# Patient Record
Sex: Male | Born: 1945 | Race: Asian | Hispanic: No | Marital: Married | Smoking: Current every day smoker
Health system: Southern US, Community
[De-identification: ages and names within clinical notes are randomized; demographics above are authoritative.]

## PROBLEM LIST (undated history)

## (undated) DIAGNOSIS — C61 Malignant neoplasm of prostate: Secondary | ICD-10-CM

## (undated) DIAGNOSIS — I10 Essential (primary) hypertension: Secondary | ICD-10-CM

## (undated) DIAGNOSIS — E785 Hyperlipidemia, unspecified: Secondary | ICD-10-CM

## (undated) DIAGNOSIS — G629 Polyneuropathy, unspecified: Secondary | ICD-10-CM

## (undated) DIAGNOSIS — M1990 Unspecified osteoarthritis, unspecified site: Secondary | ICD-10-CM

## (undated) DIAGNOSIS — H9190 Unspecified hearing loss, unspecified ear: Secondary | ICD-10-CM

## (undated) HISTORY — PX: NO PAST SURGERIES: SHX2092

## (undated) HISTORY — DX: Unspecified hearing loss, unspecified ear: H91.90

## (undated) HISTORY — DX: Polyneuropathy, unspecified: G62.9

## (undated) HISTORY — DX: Unspecified osteoarthritis, unspecified site: M19.90

---

## 1999-11-01 ENCOUNTER — Encounter: Payer: Self-pay | Admitting: Infectious Diseases

## 1999-11-01 ENCOUNTER — Ambulatory Visit (HOSPITAL_COMMUNITY): Admission: RE | Admit: 1999-11-01 | Discharge: 1999-11-01 | Payer: Self-pay | Admitting: Infectious Diseases

## 2005-08-15 ENCOUNTER — Emergency Department (HOSPITAL_COMMUNITY): Admission: EM | Admit: 2005-08-15 | Discharge: 2005-08-15 | Payer: Self-pay | Admitting: Emergency Medicine

## 2005-09-18 ENCOUNTER — Encounter: Admission: RE | Admit: 2005-09-18 | Discharge: 2005-12-17 | Payer: Self-pay | Admitting: Orthopaedic Surgery

## 2009-06-02 ENCOUNTER — Emergency Department (HOSPITAL_COMMUNITY): Admission: EM | Admit: 2009-06-02 | Discharge: 2009-06-02 | Payer: Self-pay | Admitting: Emergency Medicine

## 2010-07-28 ENCOUNTER — Emergency Department (HOSPITAL_COMMUNITY)
Admission: EM | Admit: 2010-07-28 | Discharge: 2010-07-28 | Payer: Self-pay | Source: Home / Self Care | Admitting: Emergency Medicine

## 2010-10-09 ENCOUNTER — Other Ambulatory Visit: Payer: Self-pay | Admitting: Otolaryngology

## 2010-10-09 DIAGNOSIS — E079 Disorder of thyroid, unspecified: Secondary | ICD-10-CM

## 2010-10-13 ENCOUNTER — Ambulatory Visit
Admission: RE | Admit: 2010-10-13 | Discharge: 2010-10-13 | Disposition: A | Discharge status: Home / Self Care | Payer: Self-pay | Source: Ambulatory Visit | Attending: Otolaryngology | Admitting: Otolaryngology

## 2010-10-13 DIAGNOSIS — E079 Disorder of thyroid, unspecified: Secondary | ICD-10-CM

## 2010-10-23 LAB — HEPATITIS B SURFACE ANTIGEN: Hepatitis B Surface Ag: NEGATIVE

## 2010-10-23 LAB — HIV RAPID SCREEN (BLD OR BODY FLD EXPOSURE): Rapid HIV Screen: NONREACTIVE

## 2010-10-23 LAB — HEPATITIS C ANTIBODY (REFLEX): HCV Ab: NEGATIVE

## 2010-11-16 LAB — CBC
Hemoglobin: 14.8 g/dL (ref 13.0–17.0)
Platelets: 232 10*3/uL (ref 150–400)
RDW: 16.3 % — ABNORMAL HIGH (ref 11.5–15.5)

## 2010-11-16 LAB — DIFFERENTIAL
Basophils Absolute: 0 10*3/uL (ref 0.0–0.1)
Lymphocytes Relative: 23 % (ref 12–46)
Monocytes Relative: 10 % (ref 3–12)
Neutro Abs: 7.7 10*3/uL (ref 1.7–7.7)

## 2010-11-16 LAB — POCT I-STAT, CHEM 8
Chloride: 102 mEq/L (ref 96–112)
HCT: 46 % (ref 39.0–52.0)
Potassium: 3.2 mEq/L — ABNORMAL LOW (ref 3.5–5.1)

## 2011-08-14 HISTORY — PX: BACK SURGERY: SHX140

## 2011-09-10 ENCOUNTER — Encounter: Payer: Self-pay | Admitting: Neurology

## 2011-09-13 ENCOUNTER — Ambulatory Visit
Admission: RE | Admit: 2011-09-13 | Discharge: 2011-09-13 | Disposition: A | Discharge status: Home / Self Care | Payer: Medicare Other | Source: Ambulatory Visit | Attending: Neurosurgery | Admitting: Neurosurgery

## 2011-09-13 ENCOUNTER — Other Ambulatory Visit: Payer: Self-pay | Admitting: Neurosurgery

## 2011-09-13 DIAGNOSIS — M549 Dorsalgia, unspecified: Secondary | ICD-10-CM

## 2011-09-13 MED ORDER — IOHEXOL 180 MG/ML  SOLN
1.0000 mL | Freq: Once | INTRAMUSCULAR | Status: AC | PRN
  Administered 2011-09-13: 1 mL via EPIDURAL

## 2011-09-13 MED ORDER — METHYLPREDNISOLONE ACETATE 40 MG/ML INJ SUSP (RADIOLOG
120.0000 mg | Freq: Once | INTRAMUSCULAR | Status: AC
  Administered 2011-09-13: 120 mg via EPIDURAL

## 2011-09-17 ENCOUNTER — Other Ambulatory Visit: Payer: Self-pay | Admitting: Neurosurgery

## 2011-10-01 ENCOUNTER — Encounter (HOSPITAL_COMMUNITY): Payer: Self-pay | Admitting: Pharmacy Technician

## 2011-10-03 ENCOUNTER — Other Ambulatory Visit: Payer: Self-pay

## 2011-10-03 ENCOUNTER — Encounter (HOSPITAL_COMMUNITY)
Admission: RE | Admit: 2011-10-03 | Discharge: 2011-10-03 | Disposition: A | Discharge status: Home / Self Care | Payer: PRIVATE HEALTH INSURANCE | Source: Ambulatory Visit | Attending: Neurosurgery | Admitting: Neurosurgery

## 2011-10-03 ENCOUNTER — Encounter (HOSPITAL_COMMUNITY): Payer: Self-pay

## 2011-10-03 ENCOUNTER — Encounter (HOSPITAL_COMMUNITY)
Admission: RE | Admit: 2011-10-03 | Discharge: 2011-10-03 | Disposition: A | Discharge status: Home / Self Care | Payer: PRIVATE HEALTH INSURANCE | Source: Ambulatory Visit | Attending: Anesthesiology | Admitting: Anesthesiology

## 2011-10-03 HISTORY — DX: Essential (primary) hypertension: I10

## 2011-10-03 HISTORY — DX: Malignant neoplasm of prostate: C61

## 2011-10-03 HISTORY — DX: Hyperlipidemia, unspecified: E78.5

## 2011-10-03 LAB — CBC
HCT: 42.9 % (ref 39.0–52.0)
MCH: 20.5 pg — ABNORMAL LOW (ref 26.0–34.0)
MCV: 60.8 fL — ABNORMAL LOW (ref 78.0–100.0)
Platelets: 245 10*3/uL (ref 150–400)
RDW: 16.5 % — ABNORMAL HIGH (ref 11.5–15.5)

## 2011-10-03 LAB — BASIC METABOLIC PANEL
CO2: 30 mEq/L (ref 19–32)
Calcium: 9.3 mg/dL (ref 8.4–10.5)
Chloride: 99 mEq/L (ref 96–112)
Creatinine, Ser: 1.05 mg/dL (ref 0.50–1.35)
Glucose, Bld: 89 mg/dL (ref 70–99)

## 2011-10-03 LAB — ABO/RH: ABO/RH(D): AB POS

## 2011-10-03 LAB — SURGICAL PCR SCREEN: MRSA, PCR: NEGATIVE

## 2011-10-03 LAB — TYPE AND SCREEN

## 2011-10-03 NOTE — Pre-Procedure Instructions (Signed)
20 Donivin Wirt  10/03/2011   Your procedure is scheduled on:  Wednesday February 27,2013  Report to [facility] at 0630 AM.  Call this number if you have problems the morning of surgery: [phone]   Remember:   Do not eat food:After Midnight.  May have clear liquids: up to 4 Hours before arrival. (up to 2:30am)  Clear liquids include soda, tea, black coffee, apple or grape juice, broth.  Take these medicines the morning of surgery with A SIP OF WATER: flomax   Do not wear jewelry, make-up or nail polish.  Do not wear lotions, powders, or perfumes. You may wear deodorant.  Do not shave 48 hours prior to surgery.  Do not bring valuables to the hospital.  Contacts, dentures or bridgework may not be worn into surgery.  Leave suitcase in the car. After surgery it may be brought to your room.  For patients admitted to the hospital, checkout time is 11:00 AM the day of discharge.   Patients discharged the day of surgery will not be allowed to drive home.  Name and phone number of your driver: Calder Oblinger [phone]  Special Instructions: CHG Shower Use Special Wash: 1/2 bottle night before surgery and 1/2 bottle morning of surgery.   Please read over the following fact sheets that you were given: Pain Booklet, Coughing and Deep Breathing, Blood Transfusion Information, MRSA Information and Surgical Site Infection Prevention

## 2011-10-09 MED ORDER — CEFAZOLIN SODIUM 1-5 GM-% IV SOLN
1.0000 g | INTRAVENOUS | Status: AC
  Administered 2011-10-10: 1 g via INTRAVENOUS
  Filled 2011-10-09: qty 50

## 2011-10-10 ENCOUNTER — Encounter (HOSPITAL_COMMUNITY): Payer: Self-pay | Admitting: Anesthesiology

## 2011-10-10 ENCOUNTER — Encounter (HOSPITAL_COMMUNITY)
Admission: RE | Disposition: A | Discharge status: Home Health | Payer: Self-pay | Source: Ambulatory Visit | Attending: Neurosurgery

## 2011-10-10 ENCOUNTER — Inpatient Hospital Stay (HOSPITAL_COMMUNITY): Payer: PRIVATE HEALTH INSURANCE

## 2011-10-10 ENCOUNTER — Inpatient Hospital Stay (HOSPITAL_COMMUNITY): Payer: PRIVATE HEALTH INSURANCE | Admitting: Anesthesiology

## 2011-10-10 ENCOUNTER — Inpatient Hospital Stay (HOSPITAL_COMMUNITY)
Admission: RE | Admit: 2011-10-10 | Discharge: 2011-10-17 | DRG: 459 | Disposition: A | Discharge status: Home Health | Payer: PRIVATE HEALTH INSURANCE | Source: Ambulatory Visit | Attending: Neurosurgery | Admitting: Neurosurgery

## 2011-10-10 DIAGNOSIS — K5900 Constipation, unspecified: Secondary | ICD-10-CM | POA: Diagnosis not present

## 2011-10-10 DIAGNOSIS — M48062 Spinal stenosis, lumbar region with neurogenic claudication: Principal | ICD-10-CM | POA: Diagnosis present

## 2011-10-10 DIAGNOSIS — M9943 Connective tissue stenosis of neural canal of lumbar region: Secondary | ICD-10-CM

## 2011-10-10 DIAGNOSIS — T50995A Adverse effect of other drugs, medicaments and biological substances, initial encounter: Secondary | ICD-10-CM | POA: Diagnosis not present

## 2011-10-10 DIAGNOSIS — F17200 Nicotine dependence, unspecified, uncomplicated: Secondary | ICD-10-CM | POA: Diagnosis present

## 2011-10-10 DIAGNOSIS — Z8546 Personal history of malignant neoplasm of prostate: Secondary | ICD-10-CM

## 2011-10-10 DIAGNOSIS — E876 Hypokalemia: Secondary | ICD-10-CM | POA: Diagnosis not present

## 2011-10-10 DIAGNOSIS — G92 Toxic encephalopathy: Secondary | ICD-10-CM | POA: Diagnosis not present

## 2011-10-10 DIAGNOSIS — G929 Unspecified toxic encephalopathy: Secondary | ICD-10-CM | POA: Diagnosis not present

## 2011-10-10 DIAGNOSIS — Z79899 Other long term (current) drug therapy: Secondary | ICD-10-CM

## 2011-10-10 DIAGNOSIS — Z01812 Encounter for preprocedural laboratory examination: Secondary | ICD-10-CM

## 2011-10-10 DIAGNOSIS — Q762 Congenital spondylolisthesis: Secondary | ICD-10-CM

## 2011-10-10 DIAGNOSIS — J189 Pneumonia, unspecified organism: Secondary | ICD-10-CM | POA: Diagnosis not present

## 2011-10-10 DIAGNOSIS — I10 Essential (primary) hypertension: Secondary | ICD-10-CM | POA: Diagnosis present

## 2011-10-10 DIAGNOSIS — M5137 Other intervertebral disc degeneration, lumbosacral region: Secondary | ICD-10-CM | POA: Diagnosis present

## 2011-10-10 DIAGNOSIS — Y9210 Unspecified residential institution as the place of occurrence of the external cause: Secondary | ICD-10-CM | POA: Diagnosis not present

## 2011-10-10 DIAGNOSIS — M51379 Other intervertebral disc degeneration, lumbosacral region without mention of lumbar back pain or lower extremity pain: Secondary | ICD-10-CM | POA: Diagnosis present

## 2011-10-10 DIAGNOSIS — E785 Hyperlipidemia, unspecified: Secondary | ICD-10-CM | POA: Diagnosis present

## 2011-10-10 SURGERY — POSTERIOR LUMBAR FUSION 2 LEVEL
Anesthesia: General | Site: Spine Lumbar | Laterality: Bilateral | Wound class: Clean

## 2011-10-10 MED ORDER — PHENYLEPHRINE HCL 10 MG/ML IJ SOLN
10.0000 mg | INTRAMUSCULAR | Status: DC | PRN
  Administered 2011-10-10: 10 ug/min via INTRAVENOUS

## 2011-10-10 MED ORDER — THROMBIN 20000 UNITS EX KIT
PACK | CUTANEOUS | Status: DC | PRN
  Administered 2011-10-10: 10:00:00 via TOPICAL

## 2011-10-10 MED ORDER — MENTHOL 3 MG MT LOZG: 1.0000 | LOZENGE | OROMUCOSAL | Status: DC | PRN

## 2011-10-10 MED ORDER — SODIUM CHLORIDE 0.9 % IV SOLN
INTRAVENOUS | Status: DC
  Administered 2011-10-10 – 2011-10-11 (×2): via INTRAVENOUS

## 2011-10-10 MED ORDER — EPHEDRINE SULFATE 50 MG/ML IJ SOLN
INTRAMUSCULAR | Status: DC | PRN
  Administered 2011-10-10 (×5): 5 mg via INTRAVENOUS

## 2011-10-10 MED ORDER — DEXTROSE 5 % IV SOLN
INTRAVENOUS | Status: DC | PRN
  Administered 2011-10-10: 09:00:00 via INTRAVENOUS

## 2011-10-10 MED ORDER — ACETAMINOPHEN 650 MG RE SUPP: 650.0000 mg | RECTAL | Status: DC | PRN

## 2011-10-10 MED ORDER — MIDAZOLAM HCL 5 MG/5ML IJ SOLN
INTRAMUSCULAR | Status: DC | PRN
  Administered 2011-10-10: 0.5 mg via INTRAVENOUS

## 2011-10-10 MED ORDER — HYDROMORPHONE HCL PF 1 MG/ML IJ SOLN
0.2500 mg | INTRAMUSCULAR | Status: DC | PRN
  Administered 2011-10-10 (×2): 0.5 mg via INTRAVENOUS

## 2011-10-10 MED ORDER — ONDANSETRON HCL 4 MG/2ML IJ SOLN: 4.0000 mg | Freq: Once | INTRAMUSCULAR | Status: DC | PRN

## 2011-10-10 MED ORDER — SIMVASTATIN 20 MG PO TABS
20.0000 mg | ORAL_TABLET | Freq: Every evening | ORAL | Status: DC
  Administered 2011-10-10 – 2011-10-16 (×7): 20 mg via ORAL
  Filled 2011-10-10 (×8): qty 1

## 2011-10-10 MED ORDER — SODIUM CHLORIDE 0.9 % IV SOLN
INTRAVENOUS | Status: AC
  Filled 2011-10-10: qty 500

## 2011-10-10 MED ORDER — BUPIVACAINE-EPINEPHRINE PF 0.5-1:200000 % IJ SOLN
INTRAMUSCULAR | Status: DC | PRN
  Administered 2011-10-10: 18 mL

## 2011-10-10 MED ORDER — TAMSULOSIN HCL 0.4 MG PO CAPS
0.4000 mg | ORAL_CAPSULE | Freq: Every day | ORAL | Status: DC
  Administered 2011-10-10 – 2011-10-16 (×7): 0.4 mg via ORAL
  Filled 2011-10-10 (×9): qty 1

## 2011-10-10 MED ORDER — ONDANSETRON HCL 4 MG/2ML IJ SOLN: 4.0000 mg | INTRAMUSCULAR | Status: DC | PRN

## 2011-10-10 MED ORDER — CEFAZOLIN SODIUM 1-5 GM-% IV SOLN
1.0000 g | Freq: Three times a day (TID) | INTRAVENOUS | Status: AC
  Administered 2011-10-10 – 2011-10-11 (×2): 1 g via INTRAVENOUS
  Filled 2011-10-10 (×2): qty 50

## 2011-10-10 MED ORDER — NEOSTIGMINE METHYLSULFATE 1 MG/ML IJ SOLN
INTRAMUSCULAR | Status: DC | PRN
  Administered 2011-10-10: 3 mg via INTRAVENOUS

## 2011-10-10 MED ORDER — LIDOCAINE HCL (CARDIAC) 20 MG/ML IV SOLN
INTRAVENOUS | Status: DC | PRN
  Administered 2011-10-10: 80 mg via INTRAVENOUS

## 2011-10-10 MED ORDER — GLYCOPYRROLATE 0.2 MG/ML IJ SOLN
INTRAMUSCULAR | Status: DC | PRN
  Administered 2011-10-10: 0.4 mg via INTRAVENOUS

## 2011-10-10 MED ORDER — HYDROMORPHONE HCL PF 1 MG/ML IJ SOLN
INTRAMUSCULAR | Status: AC
  Filled 2011-10-10: qty 1

## 2011-10-10 MED ORDER — OXYCODONE-ACETAMINOPHEN 5-325 MG PO TABS
1.0000 | ORAL_TABLET | ORAL | Status: DC | PRN
  Administered 2011-10-11 – 2011-10-12 (×4): 2 via ORAL
  Administered 2011-10-12: 1 via ORAL
  Filled 2011-10-10: qty 2
  Filled 2011-10-10: qty 1
  Filled 2011-10-10 (×3): qty 2

## 2011-10-10 MED ORDER — ONDANSETRON HCL 4 MG/2ML IJ SOLN
INTRAMUSCULAR | Status: DC | PRN
  Administered 2011-10-10: 4 mg via INTRAVENOUS

## 2011-10-10 MED ORDER — DIAZEPAM 5 MG PO TABS
5.0000 mg | ORAL_TABLET | Freq: Four times a day (QID) | ORAL | Status: DC | PRN
  Administered 2011-10-10 – 2011-10-12 (×5): 5 mg via ORAL
  Filled 2011-10-10 (×5): qty 1

## 2011-10-10 MED ORDER — PHENOL 1.4 % MT LIQD: 1.0000 | OROMUCOSAL | Status: DC | PRN

## 2011-10-10 MED ORDER — ACETAMINOPHEN 325 MG PO TABS
650.0000 mg | ORAL_TABLET | ORAL | Status: DC | PRN
  Administered 2011-10-10 – 2011-10-11 (×2): 650 mg via ORAL
  Administered 2011-10-12: 325 mg via ORAL
  Administered 2011-10-12 – 2011-10-14 (×4): 650 mg via ORAL
  Filled 2011-10-10 (×2): qty 2
  Filled 2011-10-10: qty 1
  Filled 2011-10-10 (×4): qty 2

## 2011-10-10 MED ORDER — HYDROCHLOROTHIAZIDE 25 MG PO TABS
25.0000 mg | ORAL_TABLET | Freq: Every day | ORAL | Status: DC
  Administered 2011-10-11: 25 mg via ORAL
  Filled 2011-10-10 (×3): qty 1

## 2011-10-10 MED ORDER — LACTATED RINGERS IV SOLN
INTRAVENOUS | Status: DC | PRN
  Administered 2011-10-10 (×4): via INTRAVENOUS

## 2011-10-10 MED ORDER — ROCURONIUM BROMIDE 100 MG/10ML IV SOLN
INTRAVENOUS | Status: DC | PRN
  Administered 2011-10-10 (×2): 5 mg via INTRAVENOUS
  Administered 2011-10-10: 50 mg via INTRAVENOUS

## 2011-10-10 MED ORDER — SODIUM CHLORIDE 0.9 % IJ SOLN
3.0000 mL | Freq: Two times a day (BID) | INTRAMUSCULAR | Status: DC
  Administered 2011-10-12: 3 mL via INTRAVENOUS

## 2011-10-10 MED ORDER — 0.9 % SODIUM CHLORIDE (POUR BTL) OPTIME
TOPICAL | Status: DC | PRN
  Administered 2011-10-10: 1000 mL

## 2011-10-10 MED ORDER — ZOLPIDEM TARTRATE 5 MG PO TABS: 5.0000 mg | ORAL_TABLET | Freq: Every evening | ORAL | Status: DC | PRN

## 2011-10-10 MED ORDER — SODIUM CHLORIDE 0.9 % IJ SOLN: 3.0000 mL | INTRAMUSCULAR | Status: DC | PRN

## 2011-10-10 MED ORDER — ALBUMIN HUMAN 5 % IV SOLN
INTRAVENOUS | Status: DC | PRN
  Administered 2011-10-10 (×2): via INTRAVENOUS

## 2011-10-10 MED ORDER — FENTANYL CITRATE 0.05 MG/ML IJ SOLN
INTRAMUSCULAR | Status: DC | PRN
  Administered 2011-10-10: 50 ug via INTRAVENOUS
  Administered 2011-10-10: 150 ug via INTRAVENOUS

## 2011-10-10 MED ORDER — BACITRACIN 50000 UNITS IM SOLR
INTRAMUSCULAR | Status: AC
  Filled 2011-10-10: qty 1

## 2011-10-10 MED ORDER — SODIUM CHLORIDE 0.9 % IV SOLN
250.0000 mL | INTRAVENOUS | Status: DC
Start: 2011-10-10 — End: 2011-10-12

## 2011-10-10 MED ORDER — PROPOFOL 10 MG/ML IV EMUL
INTRAVENOUS | Status: DC | PRN
  Administered 2011-10-10: 30 mg via INTRAVENOUS
  Administered 2011-10-10: 120 mg via INTRAVENOUS

## 2011-10-10 MED ORDER — MORPHINE SULFATE 4 MG/ML IJ SOLN
4.0000 mg | INTRAMUSCULAR | Status: DC | PRN
  Administered 2011-10-10: 4 mg via INTRAVENOUS
  Administered 2011-10-10: 2 mg via INTRAVENOUS
  Administered 2011-10-11 – 2011-10-12 (×2): 4 mg via INTRAVENOUS
  Filled 2011-10-10 (×4): qty 1

## 2011-10-10 SURGICAL SUPPLY — 68 items
APL SKNCLS STERI-STRIP NONHPOA (GAUZE/BANDAGES/DRESSINGS) ×1
BENZOIN TINCTURE PRP APPL 2/3 (GAUZE/BANDAGES/DRESSINGS) ×2 IMPLANT
BLADE SURG ROTATE 9660 (MISCELLANEOUS) IMPLANT
BUR ACORN 6.0 (BURR) ×2 IMPLANT
BUR MATCHSTICK NEURO 3.0 LAGG (BURR) ×3 IMPLANT
CANISTER SUCTION 2500CC (MISCELLANEOUS) ×2 IMPLANT
CAP REVERE LOCKING (Cap) ×4 IMPLANT
CLOTH BEACON ORANGE TIMEOUT ST (SAFETY) ×2 IMPLANT
CONT SPEC 4OZ CLIKSEAL STRL BL (MISCELLANEOUS) ×3 IMPLANT
COVER BACK TABLE 24X17X13 BIG (DRAPES) IMPLANT
COVER TABLE BACK 60X90 (DRAPES) ×2 IMPLANT
DRAPE C-ARM 42X72 X-RAY (DRAPES) ×4 IMPLANT
DRAPE LAPAROTOMY 100X72X124 (DRAPES) ×2 IMPLANT
DRAPE POUCH INSTRU U-SHP 10X18 (DRAPES) ×2 IMPLANT
DRSG PAD ABDOMINAL 8X10 ST (GAUZE/BANDAGES/DRESSINGS) IMPLANT
DURAPREP 26ML APPLICATOR (WOUND CARE) ×2 IMPLANT
ELECT REM PT RETURN 9FT ADLT (ELECTROSURGICAL) ×2
ELECTRODE REM PT RTRN 9FT ADLT (ELECTROSURGICAL) ×1 IMPLANT
EVACUATOR 1/8 PVC DRAIN (DRAIN) IMPLANT
GAUZE SPONGE 4X4 12PLY STRL LF (GAUZE/BANDAGES/DRESSINGS) ×1 IMPLANT
GAUZE SPONGE 4X4 16PLY XRAY LF (GAUZE/BANDAGES/DRESSINGS) ×2 IMPLANT
GLOVE BIOGEL M 8.0 STRL (GLOVE) ×3 IMPLANT
GLOVE BIOGEL PI IND STRL 7.0 (GLOVE) IMPLANT
GLOVE BIOGEL PI INDICATOR 7.0 (GLOVE) ×1
GLOVE ECLIPSE 6.5 STRL STRAW (GLOVE) ×1 IMPLANT
GLOVE EXAM NITRILE LRG STRL (GLOVE) IMPLANT
GLOVE EXAM NITRILE MD LF STRL (GLOVE) ×1 IMPLANT
GLOVE EXAM NITRILE XL STR (GLOVE) IMPLANT
GLOVE EXAM NITRILE XS STR PU (GLOVE) IMPLANT
GLOVE SURG SS PI 6.5 STRL IVOR (GLOVE) ×2 IMPLANT
GOWN BRE IMP SLV AUR LG STRL (GOWN DISPOSABLE) ×4 IMPLANT
GOWN BRE IMP SLV AUR XL STRL (GOWN DISPOSABLE) ×1 IMPLANT
GOWN STRL REIN 2XL LVL4 (GOWN DISPOSABLE) IMPLANT
KIT BASIN OR (CUSTOM PROCEDURE TRAY) ×2 IMPLANT
KIT ROOM TURNOVER OR (KITS) ×2 IMPLANT
NDL HYPO 18GX1.5 BLUNT FILL (NEEDLE) IMPLANT
NDL HYPO 21X1.5 SAFETY (NEEDLE) IMPLANT
NDL HYPO 25X1 1.5 SAFETY (NEEDLE) IMPLANT
NEEDLE HYPO 18GX1.5 BLUNT FILL (NEEDLE) IMPLANT
NEEDLE HYPO 21X1.5 SAFETY (NEEDLE) IMPLANT
NEEDLE HYPO 25X1 1.5 SAFETY (NEEDLE) ×2 IMPLANT
NS IRRIG 1000ML POUR BTL (IV SOLUTION) ×2 IMPLANT
PACK FOAM VITOSS 10CC (Orthopedic Implant) ×1 IMPLANT
PACK LAMINECTOMY NEURO (CUSTOM PROCEDURE TRAY) ×2 IMPLANT
PAD ARMBOARD 7.5X6 YLW CONV (MISCELLANEOUS) ×6 IMPLANT
PATTIES SURGICAL .5 X1 (DISPOSABLE) ×2 IMPLANT
PATTIES SURGICAL .5 X3 (DISPOSABLE) IMPLANT
ROD REVERE CURVED 6.35X35MM (Rod) ×2 IMPLANT
SCREW REVERE 6.35 5.5X40MM (Screw) ×4 IMPLANT
SET SCREW IMPLANT
SPACER SUSTAIN O SML 8X22 12MM (Spacer) ×2 IMPLANT
SPONGE GAUZE 4X4 12PLY (GAUZE/BANDAGES/DRESSINGS) ×2 IMPLANT
SPONGE LAP 4X18 X RAY DECT (DISPOSABLE) IMPLANT
SPONGE NEURO XRAY DETECT 1X3 (DISPOSABLE) IMPLANT
SPONGE SURGIFOAM ABS GEL 100 (HEMOSTASIS) ×2 IMPLANT
STRIP CLOSURE SKIN 1/2X4 (GAUZE/BANDAGES/DRESSINGS) ×2 IMPLANT
SUT VIC AB 1 CT1 18XBRD ANBCTR (SUTURE) ×2 IMPLANT
SUT VIC AB 1 CT1 8-18 (SUTURE) ×4
SUT VIC AB 2-0 CP2 18 (SUTURE) ×3 IMPLANT
SUT VIC AB 3-0 SH 8-18 (SUTURE) ×2 IMPLANT
SYR 20CC LL (SYRINGE) ×1 IMPLANT
SYR 20ML ECCENTRIC (SYRINGE) ×2 IMPLANT
SYR 5ML LL (SYRINGE) IMPLANT
TAPE HYPAFIX 4 X10 (GAUZE/BANDAGES/DRESSINGS) ×1 IMPLANT
TOWEL OR 17X24 6PK STRL BLUE (TOWEL DISPOSABLE) ×2 IMPLANT
TOWEL OR 17X26 10 PK STRL BLUE (TOWEL DISPOSABLE) ×2 IMPLANT
TRAY FOLEY CATH 14FRSI W/METER (CATHETERS) ×2 IMPLANT
WATER STERILE IRR 1000ML POUR (IV SOLUTION) ×2 IMPLANT

## 2011-10-10 NOTE — H&P (Signed)
Scott Cruz is an 66 y.o. male.   Chief Complaint: lumbar pain HPI: lbp with radiation to lower extremities which gets worse with standing and better with flexion. He has failed with conservative treatment.  Past Medical History  Diagnosis Date  . Hypertension   . Hyperlipidemia   . Prostate ca     Past Surgical History  Procedure Date  . No past surgeries     No family history on file. Social History:  reports that he has been smoking Cigarettes.  He has a 50 pack-year smoking history. He does not have any smokeless tobacco history on file. He reports that he does not drink alcohol or use illicit drugs.  Allergies: No Known Allergies  Medications Prior to Admission  Medication Dose Route Frequency Provider Last Rate Last Dose  . ceFAZolin (ANCEF) IVPB 1 g/50 mL premix  1 g Intravenous 60 min Pre-Op Karn Cassis, MD      . DISCONTD: bacitracin 16109 UNITS injection           . DISCONTD: sodium chloride 0.9 % infusion            No current outpatient prescriptions on file as of 10/10/2011.    No results found for this or any previous visit (from the past 48 hour(s)). No results found.  Review of Systems  Constitutional: Negative.   HENT: Negative.   Eyes: Negative.   Respiratory: Negative.   Cardiovascular:       Arterial hypertension  Gastrointestinal: Negative.   Musculoskeletal: Positive for back pain.  Skin: Negative.   Neurological: Positive for focal weakness.  Endo/Heme/Allergies: Negative.   Psychiatric/Behavioral: Negative.     Blood pressure 131/82, pulse 73, temperature 97 F (36.1 C), temperature source Oral, resp. rate 18, SpO2 97.00%. Physical Exam nent, nl. Neck,nl. Lungs,nl. Cv,nl. Abdomen, nl. Extremities,nl.NEURO weakness of df both feet. slr positive at 60. difficulties walking in tip toes. Mri and xrays showed grade 1 spondylolisthesis at l4-5.severe stenosis at same level and degenerative spondylosis at l3 4 witth stenosis. Assessment/Plan l4 5  discectomy witth fusion ,pedicle screws and pla with l3 bilateral laminectomies. Surgery was explained to him with the help of the translator. Scott Cruz M 10/10/2011, 8:34 AM

## 2011-10-10 NOTE — Transfer of Care (Signed)
Immediate Anesthesia Transfer of Care Note  Patient: Scott Cruz  Procedure(s) Performed: Procedure(s) (LRB): POSTERIOR LUMBAR FUSION 2 LEVEL (Bilateral)  Patient Location: PACU  Anesthesia Type: General  Level of Consciousness: awake, alert  and oriented  Airway & Oxygen Therapy: Patient Spontanous Breathing and Patient connected to nasal cannula oxygen  Post-op Assessment: Report given to PACU RN, Post -op Vital signs reviewed and unstable, Anesthesiologist notified and Patient moving all extremities  Post vital signs: Reviewed and stable  Complications: No apparent anesthesia complications

## 2011-10-10 NOTE — Op Note (Signed)
NAMEABDIRIZAK, RICHISON NO.:  [id number]  MEDICAL RECORD NO.:  [id number]  LOCATION:  3012                         FACILITY:  [facility]  PHYSICIAN:  Hilda Lias, M.D.   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  Neurogenic claudication.  Degenerative disk disease.  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  Neurogenic claudication.  Degenerative disk disease.  PROCEDURE:  Bilateral L3-L4 laminectomy, foraminotomy, bilateral L4-5 diskectomy normally we do for routine disk to be able to insert the cages, bilateral L4 facetectomy interbody fusion with cages 12 x 22, pedicle screws L4-L5, posterolateral arthrodesis L3, L4 and L5.  Cell Saver, C-arm.  SURGEON:  Hilda Lias, MD  ASSISTANT:  Coletta Memos, MD  CLINICAL HISTORY:  Mr. Salvetti is a gentleman who is 66 year old complaining of back pain radiating to both legs.  The patient has failed conservative treatment.  The pain is getting worse.  Outpatient x-ray showed that he has severe stenosis at L3, L4, L5 with spondylolisthesis at L4-5.  Surgery was advised.  The risks were explained to him and his translator who turned out to be a medical doctor in [place].  PROCEDURE:  The patient was taken to the OR, and after intubation, he was positioned on prone manner.  The back was cleaned with DuraPrep. Drapes were applied.  Midline incision from L2-3 down to L4-5 was made and muscle retracted all the way laterally until we were able to feel the transverse process.  X-ray showed 1 clip was at the level of L5 and the other one at the level L4.  From then on, we proceeded with removal of spinous process of L3-L4, and bilateral lamina of those 2 levels. With the drill, we did a wide laminectomy with  decompression of the L3 and L4 nerve root after we removed a thick yellow ligament.  At the level of L4-L5, we proceeded with facetectomy of L4.  That was the only way to be able to go laterally, to be able to do a total diskectomy. Retraction of the thecal sac and the L4-L5 nerve root was made and the endplate were removed.  Then, the 2 cages of 12 x 22 with a Vitoss and autograft was inserted first on the right side and the other one on the left side.  C-arm x-ray showed good position of the cages.  Then, using the C-arm in AP view and then a lateral view, we probed the pedicle of L4 and L5.  We were able to introduce 4 screws of 5.5 x 40.  Prior to insertion, we probed the holes just to be sure that we were surrounded by bone.  Then, the screw were connected with the rod and kept in place with caps.  Then, we went laterally and we removed the periosteum of the lateral aspect of L3-4 and L4-5 and a mix of Vitoss and autograft were used for arthrodesis.  The area was irrigated. Valsalva maneuver was negative.  We went back again, and we probed the foramina of L3, L4, and L5  with plenty of space.  There was no violation of the pedicles.  Then, the wound was closed with multiple layers with Vicryl and Steri-Strips for the skin.          ______________________________ Hilda Lias, M.D.     EB/MEDQ  D:  10/10/2011  T:  10/10/2011  Job:  [id number]

## 2011-10-10 NOTE — Progress Notes (Signed)
Operative report [phone]

## 2011-10-10 NOTE — Progress Notes (Signed)
l3 and l4 laminectomies done with fusion at l45 cages and pedicle screws.

## 2011-10-10 NOTE — Anesthesia Postprocedure Evaluation (Signed)
  Anesthesia Post-op Note  Patient: Scott Cruz  Procedure(s) Performed: Procedure(s) (LRB): POSTERIOR LUMBAR FUSION 2 LEVEL (Bilateral)  Patient Location: PACU  Anesthesia Type: General  Level of Consciousness: awake, alert , oriented, sedated and patient cooperative  Airway and Oxygen Therapy: Patient Spontanous Breathing and Patient connected to nasal cannula oxygen  Post-op Pain: mild  Post-op Assessment: Post-op Vital signs reviewed, Patient's Cardiovascular Status Stable, Respiratory Function Stable, Patent Airway, No signs of Nausea or vomiting and Pain level controlled  Post-op Vital Signs: stable  Complications: No apparent anesthesia complications

## 2011-10-10 NOTE — Preoperative (Signed)
Beta Blockers   Reason not to administer Beta Blockers:Not Applicable 

## 2011-10-10 NOTE — Progress Notes (Signed)
Pt was admitted to the unit at 1400, upon initial assessment, his dressing was completely saturated, and was reinforced and completed.  At 1500, the dressing was completely saturated, and it was reinforced and changed. Dr. Jeral Fruit was paged and MD stated to reinforce and change dressing as needed. Patient is stable and will continue to monitor patient.

## 2011-10-10 NOTE — Anesthesia Procedure Notes (Signed)
Procedure Name: Intubation Date/Time: 10/10/2011 8:57 AM Performed by: Julianne Rice K Pre-anesthesia Checklist: Patient identified, Timeout performed, Emergency Drugs available, Suction available and Patient being monitored Patient Re-evaluated:Patient Re-evaluated prior to inductionOxygen Delivery Method: Circle system utilized Preoxygenation: Pre-oxygenation with 100% oxygen Intubation Type: IV induction Ventilation: Mask ventilation without difficulty Laryngoscope Size: Miller and 2 Grade View: Grade II Tube type: Oral Tube size: 7.5 mm Number of attempts: 1 Airway Equipment and Method: Stylet and LTA kit utilized Placement Confirmation: positive ETCO2,  ETT inserted through vocal cords under direct vision and breath sounds checked- equal and bilateral Secured at: 24 cm Tube secured with: Tape Dental Injury: Teeth and Oropharynx as per pre-operative assessment

## 2011-10-10 NOTE — Anesthesia Preprocedure Evaluation (Addendum)
Anesthesia Evaluation  Patient identified by MRN, date of birth, ID band Patient awake    Reviewed: Allergy & Precautions, H&P , NPO status , Patient's Chart, lab work & pertinent test results  Airway Mallampati: I TM Distance: >3 FB Neck ROM: full    Dental   Pulmonary Current Smoker,          Cardiovascular hypertension, Pt. on medications regular Normal    Neuro/Psych    GI/Hepatic   Endo/Other    Renal/GU Currently being worked up for elevated PSA     Musculoskeletal   Abdominal   Peds  Hematology   Anesthesia Other Findings   Reproductive/Obstetrics                        Anesthesia Physical Anesthesia Plan  ASA: II  Anesthesia Plan: General   Post-op Pain Management:    Induction: Intravenous  Airway Management Planned: Oral ETT  Additional Equipment:   Intra-op Plan:   Post-operative Plan: Extubation in OR  Informed Consent: I have reviewed the patients History and Physical, chart, labs and discussed the procedure including the risks, benefits and alternatives for the proposed anesthesia with the patient or authorized representative who has indicated his/her understanding and acceptance.   Dental advisory given  Plan Discussed with: Anesthesiologist, Surgeon and CRNA  Anesthesia Plan Comments:        Anesthesia Quick Evaluation

## 2011-10-11 LAB — CBC
HCT: 31.1 % — ABNORMAL LOW (ref 39.0–52.0)
MCHC: 33.4 g/dL (ref 30.0–36.0)
Platelets: 183 10*3/uL (ref 150–400)
RDW: 16.4 % — ABNORMAL HIGH (ref 11.5–15.5)
WBC: 9.7 10*3/uL (ref 4.0–10.5)

## 2011-10-11 LAB — DIFFERENTIAL
Basophils Absolute: 0 10*3/uL (ref 0.0–0.1)
Eosinophils Absolute: 0.1 10*3/uL (ref 0.0–0.7)
Lymphocytes Relative: 22 % (ref 12–46)
Monocytes Relative: 12 % (ref 3–12)
Neutro Abs: 6.3 10*3/uL (ref 1.7–7.7)
Neutrophils Relative %: 65 % (ref 43–77)

## 2011-10-11 LAB — BASIC METABOLIC PANEL
Calcium: 8.3 mg/dL — ABNORMAL LOW (ref 8.4–10.5)
GFR calc Af Amer: 76 mL/min — ABNORMAL LOW (ref >90)
GFR calc non Af Amer: 65 mL/min — ABNORMAL LOW (ref >90)
Potassium: 3.5 mEq/L (ref 3.5–5.1)
Sodium: 140 mEq/L (ref 135–145)

## 2011-10-11 LAB — APTT: aPTT: 38 seconds — ABNORMAL HIGH (ref 24–37)

## 2011-10-11 NOTE — Progress Notes (Signed)
Occupational Therapy Evaluation Patient Details Name: Scott Cruz MRN: [id number] DOB: 1945/08/26 Today's Date: 10/11/2011  Problem List: There is no problem list on file for this patient.   Past Medical History:  Past Medical History  Diagnosis Date  . Hypertension   . Hyperlipidemia   . Prostate ca    Past Surgical History:  Past Surgical History  Procedure Date  . No past surgeries     OT Assessment/Plan/Recommendation OT Assessment Clinical Impression Statement: Pt s/p L3 and L4 laminectomies thus affecting PLOF.  Will benefit from acute OT to address below problem list in prep for d/c home with family. OT Recommendation/Assessment: Patient will need skilled OT in the acute care venue OT Problem List: Decreased activity tolerance;Decreased knowledge of use of DME or AE;Decreased knowledge of precautions;Pain OT Therapy Diagnosis : Generalized weakness;Acute pain OT Plan OT Frequency: Min 2X/week OT Treatment/Interventions: Self-care/ADL training;DME and/or AE instruction;Therapeutic activities;Patient/family education OT Recommendation Follow Up Recommendations: Home health OT Equipment Recommended: 3 in 1 bedside comode;Other (comment) (tub DME to be determined) Individuals Consulted Consulted and Agree with Results and Recommendations: Patient OT Goals Acute Rehab OT Goals OT Goal Formulation: With patient Time For Goal Achievement: 7 days ADL Goals Pt Will Perform Grooming: with modified independence;Standing at sink ADL Goal: Grooming - Progress: Goal set today Pt Will Perform Lower Body Bathing: with modified independence;Sit to stand from chair;Sit to stand from bed;with adaptive equipment ADL Goal: Lower Body Bathing - Progress: Goal set today Pt Will Perform Lower Body Dressing: with modified independence;Sit to stand from chair;Sit to stand from bed;with adaptive equipment ADL Goal: Lower Body Dressing - Progress: Goal set today Pt Will Transfer to Toilet: with  modified independence;with DME;3-in-1;Ambulation ADL Goal: Toilet Transfer - Progress: Goal set today Pt Will Perform Tub/Shower Transfer: Tub transfer;with DME;Ambulation ADL Goal: Tub/Shower Transfer - Progress: Goal set today Miscellaneous OT Goals Miscellaneous OT Goal #1: Pt will perform bed mobility with mod I in prep for EOB ADLs. OT Goal: Miscellaneous Goal #1 - Progress: Goal set today  OT Evaluation Precautions/Restrictions  Restrictions Weight Bearing Restrictions: No Prior Functioning Home Living Lives With: Spouse;Family (lives with daughter) Dolores Lory Help From: Family Type of Home: House Home Layout: Two level;Full bath on main level Alternate Level Stairs-Rails: Right Alternate Level Stairs-Number of Steps: 14 Home Access: Level entry Bathroom Shower/Tub: Tub/shower unit;Curtain Bathroom Toilet: Standard Bathroom Accessibility: Yes How Accessible: Accessible via walker Home Adaptive Equipment: None Prior Function Level of Independence: Independent with basic ADLs;Independent with gait;Independent with transfers Able to Take Stairs?: Yes Driving: Yes Vocation: Part time employment (works at [employer] with [employer]) ADL ADL Lower Body Bathing: Simulated;Maximal assistance Lower Body Bathing Details (indicate cue type and reason): due to pain and back precautions Where Assessed - Lower Body Bathing: Sit to stand from bed Lower Body Dressing: Simulated;Maximal assistance Lower Body Dressing Details (indicate cue type and reason): due to pain and back precautions  Where Assessed - Lower Body Dressing: Sit to stand from bed Toilet Transfer: Simulated;Moderate assistance Toilet Transfer Details (indicate cue type and reason): verbal and manual cues for maneuvering RW Toilet Transfer Method: Ambulating Toilet Transfer Equipment: Other (comment) (chair) Equipment Used: Rolling walker ADL Comments: Pt donned back brace with max assist sitting  EOB Vision/Perception    Cognition Cognition Arousal/Alertness: Awake/alert Orientation Level: Oriented X4 Sensation/Coordination   Extremity Assessment RUE Assessment RUE Assessment: Within Functional Limits LUE Assessment LUE Assessment: Within Functional Limits Mobility  Bed Mobility Bed Mobility: Yes Rolling Right: 3:  Mod assist Rolling Right Details (indicate cue type and reason): verbal and tactile cues given for sequencing log roll Right Sidelying to Sit: 3: Mod assist Right Sidelying to Sit Details (indicate cue type and reason): assist to trunk and shoulders to maintain back precautions Sitting - Scoot to Edge of Bed: 4: Min assist Sitting - Scoot to Edge of Bed Details (indicate cue type and reason): with use of pad Transfers Transfers: Yes Sit to Stand: 3: Mod assist;From bed;With upper extremity assist Sit to Stand Details (indicate cue type and reason): VC for maintaining back precautions Stand to Sit: 3: Mod assist;To chair/3-in-1;With armrests;With upper extremity assist Stand to Sit Details: verbal and tactile cues to reach back for arm rests; pt required demonstration before able to safely complete transfer Exercises   End of Session OT - End of Session Equipment Utilized During Treatment: Gait belt;Back brace Activity Tolerance: Patient limited by pain;Other (comment) Patient left: in chair;with call bell in reach;with family/visitor present Nurse Communication: Mobility status for transfers;Mobility status for ambulation;Other (comment) (pt maintained BP throughout session) General Behavior During Session: [facility] for tasks performed Cognition: [facility] for tasks performed   Cipriano Mile 10/11/2011, 3:52 PM  10/11/2011 Cipriano Mile OTR/L Pager [phone] Office [phone]

## 2011-10-11 NOTE — Evaluation (Signed)
Physical Therapy Evaluation Patient Details Name: Scott Cruz MRN: [id number] DOB: August 03, 1946 Today's Date: 10/11/2011  Problem List: There is no problem list on file for this patient.   Past Medical History:  Past Medical History  Diagnosis Date  . Hypertension   . Hyperlipidemia   . Prostate ca    Past Surgical History:  Past Surgical History  Procedure Date  . No past surgeries     PT Assessment/Plan/Recommendation PT Assessment Clinical Impression Statement: Pt is 66 y/o male admitted for lumbar fusion limited due to pain.  Pt will benefit from acute PT services to improve overall mobility to prepare for safe d/c home with family. PT Recommendation/Assessment: Patient will need skilled PT in the acute care venue PT Problem List: Decreased strength;Decreased activity tolerance;Decreased balance;Decreased knowledge of use of DME;Decreased knowledge of precautions;Pain PT Therapy Diagnosis : Difficulty walking;Acute pain PT Plan PT Frequency: Min 5X/week PT Treatment/Interventions: DME instruction;Gait training;Functional mobility training;Therapeutic activities;Therapeutic exercise;Balance training;Patient/family education PT Recommendation Follow Up Recommendations: Home health PT Equipment Recommended: 3 in 1 bedside comode;Other (comment);Rolling walker with 5" wheels (tub DME to be determined) PT Goals  Acute Rehab PT Goals PT Goal Formulation: With patient Time For Goal Achievement: 7 days Pt will Roll Supine to Right Side: with modified independence PT Goal: Rolling Supine to Right Side - Progress: Goal set today Pt will go Supine/Side to Sit: with modified independence PT Goal: Supine/Side to Sit - Progress: Goal set today Pt will go Sit to Supine/Side: with modified independence PT Goal: Sit to Supine/Side - Progress: Goal set today Pt will go Sit to Stand: with modified independence PT Goal: Sit to Stand - Progress: Goal set today Pt will go Stand to Sit: with  modified independence PT Goal: Stand to Sit - Progress: Goal set today Pt will Ambulate: >150 feet;with modified independence;with rolling walker PT Goal: Ambulate - Progress: Goal set today  PT Evaluation Precautions/Restrictions  Precautions Precautions: Back Precaution Booklet Issued: Yes (comment) Precaution Comments: Reviewed 3/3 back precautions. Required Braces or Orthoses: Yes Spinal Brace: Lumbar corset;Applied in sitting position Restrictions Weight Bearing Restrictions: No Prior Functioning  Home Living Lives With: Spouse;Family (lives with daughter) Dolores Lory Help From: Family Type of Home: House Home Layout: Two level;Full bath on main level Alternate Level Stairs-Rails: Right Alternate Level Stairs-Number of Steps: 14 Home Access: Level entry Bathroom Shower/Tub: Tub/shower unit;Curtain Bathroom Toilet: Standard Bathroom Accessibility: Yes How Accessible: Accessible via walker Home Adaptive Equipment: None Prior Function Level of Independence: Independent with basic ADLs;Independent with gait;Independent with transfers Able to Take Stairs?: Yes Driving: Yes Vocation: Part time employment (works at [employer] with [employer]) Cognition Cognition Arousal/Alertness: Awake/alert Orientation Level: Oriented X4 Sensation/Coordination Sensation Light Touch: Appears Intact Extremity Assessment RUE Assessment RUE Assessment: Within Functional Limits LUE Assessment LUE Assessment: Within Functional Limits RLE Assessment RLE Assessment: Within Functional Limits LLE Assessment LLE Assessment: Within Functional Limits Mobility (including Balance) Bed Mobility Bed Mobility: Yes Rolling Right: 3: Mod assist Rolling Right Details (indicate cue type and reason): (A) to initiate and complete roll with cues for proper technique and sequencing to prevent twisting. Right Sidelying to Sit: 3: Mod assist Right Sidelying to Sit Details (indicate cue type and  reason): (A) to elevate trunk OOB and LE OOB with cues for proper technique Sitting - Scoot to Edge of Bed: 4: Min assist Sitting - Scoot to [place] of Bed Details (indicate cue type and reason): with use of pad Transfers Transfers: Yes Sit to Stand: 3: Mod assist;From  bed;With upper extremity assist Sit to Stand Details (indicate cue type and reason): (A) to initiate transfer with proper cues for technique. Stand to Sit: 3: Mod assist;To chair/3-in-1;With armrests;With upper extremity assist Stand to Sit Details:   verbal and tactile cues to reach back for arm rests; pt required demonstration before able to safely complete transfer  Ambulation/Gait Ambulation/Gait: Yes Ambulation/Gait Assistance: 4: Min assist Ambulation/Gait Assistance Details (indicate cue type and reason): (A) to maintain balance and proper body position within RW.  Pt tends to ambulate too far from RW.  Pt having difficulty manuevering RW. Ambulation Distance (Feet): 30 Feet Assistive device: Rolling walker Gait Pattern: Step-to pattern Stairs: No Wheelchair Mobility Wheelchair Mobility: No  Posture/Postural Control Posture/Postural Control: No significant limitations Balance Balance Assessed: No Exercise    End of Session PT - End of Session Equipment Utilized During Treatment: Gait belt;Back brace Activity Tolerance: Patient tolerated treatment well Patient left: in chair;with call bell in reach Nurse Communication: Mobility status for transfers;Mobility status for ambulation General Behavior During Session: [facility] for tasks performed Cognition: [facility] for tasks performed  Kseniya Grunden 10/11/2011, 4:07 PM Pager:  [phone]

## 2011-10-11 NOTE — Progress Notes (Signed)
UR COMPLETED  

## 2011-10-11 NOTE — Progress Notes (Signed)
Noted order for [facility] and DME, however PT/OT eval not yet in chart, will follow for recommendations.  Johny Shock RN MPH Case Manager [phone]

## 2011-10-11 NOTE — Progress Notes (Signed)
At 0200, pt had temp of 101.7 F. Pt given incentive spirometer and he demonstrated appropriate use. Acetaminophen given and temp came  down to 98.9 F at 0300.  Pt's dressing saturated at this time. Active bleeding noted when no  pressure applied to site. Dressing changed. Will continue to monitor.

## 2011-10-11 NOTE — Progress Notes (Signed)
Patient ID: Scott Cruz, male   DOB: 23-Feb-1946, 66 y.o.   MRN: [id number] Wound saturated. No leg pain but incisional pain. Had increase temp last nite. Getting IS. Ot/pt to see. Foley to be removed once he is oob. Lab w/u pending.

## 2011-10-12 ENCOUNTER — Inpatient Hospital Stay (HOSPITAL_COMMUNITY): Payer: PRIVATE HEALTH INSURANCE

## 2011-10-12 DIAGNOSIS — E860 Dehydration: Secondary | ICD-10-CM

## 2011-10-12 DIAGNOSIS — J159 Unspecified bacterial pneumonia: Secondary | ICD-10-CM

## 2011-10-12 DIAGNOSIS — G9340 Encephalopathy, unspecified: Secondary | ICD-10-CM

## 2011-10-12 LAB — URINALYSIS, ROUTINE W REFLEX MICROSCOPIC
Bilirubin Urine: NEGATIVE
Glucose, UA: NEGATIVE mg/dL
Ketones, ur: 40 mg/dL — AB
Nitrite: NEGATIVE
Protein, ur: 100 mg/dL — AB
pH: 6 (ref 5.0–8.0)

## 2011-10-12 LAB — BLOOD GAS, ARTERIAL
Drawn by: 33721
O2 Content: 2 L/min
Patient temperature: 98.6
TCO2: 33.5 mmol/L (ref 0–100)
pCO2 arterial: 45.3 mmHg — ABNORMAL HIGH (ref 35.0–45.0)
pH, Arterial: 7.464 — ABNORMAL HIGH (ref 7.350–7.450)

## 2011-10-12 LAB — LACTIC ACID, PLASMA: Lactic Acid, Venous: 0.9 mmol/L (ref 0.5–2.2)

## 2011-10-12 LAB — URINE MICROSCOPIC-ADD ON

## 2011-10-12 LAB — PROCALCITONIN: Procalcitonin: 1.71 ng/mL

## 2011-10-12 MED ORDER — HYDROCODONE-ACETAMINOPHEN 10-325 MG PO TABS
1.0000 | ORAL_TABLET | ORAL | Status: DC | PRN
  Administered 2011-10-12 – 2011-10-17 (×10): 1 via ORAL
  Filled 2011-10-12 (×10): qty 1

## 2011-10-12 MED ORDER — LEVOFLOXACIN IN D5W 750 MG/150ML IV SOLN
750.0000 mg | INTRAVENOUS | Status: DC
  Administered 2011-10-12 – 2011-10-14 (×3): 750 mg via INTRAVENOUS
  Filled 2011-10-12 (×4): qty 150

## 2011-10-12 MED ORDER — TAMSULOSIN HCL 0.4 MG PO CAPS: 0.4000 mg | ORAL_CAPSULE | Freq: Every day | ORAL | Status: DC

## 2011-10-12 MED ORDER — ALBUTEROL SULFATE (5 MG/ML) 0.5% IN NEBU: 2.5000 mg | INHALATION_SOLUTION | RESPIRATORY_TRACT | Status: DC | PRN

## 2011-10-12 MED ORDER — DIAZEPAM 2 MG PO TABS: 2.0000 mg | ORAL_TABLET | Freq: Four times a day (QID) | ORAL | Status: DC | PRN

## 2011-10-12 MED ORDER — SODIUM CHLORIDE 0.9 % IV SOLN
INTRAVENOUS | Status: DC
  Administered 2011-10-12 – 2011-10-14 (×2): via INTRAVENOUS

## 2011-10-12 NOTE — Progress Notes (Signed)
Patient ID: Scott Cruz, male   DOB: 05-31-1946, 66 y.o.   MRN: [id number] Seen by pulmonologist. To r/o pneumonia. Still quite sleepy. See new orders. Dressing dry.

## 2011-10-12 NOTE — Consult Note (Signed)
Name: Scott Cruz MRN: [id number] DOB: 1946/06/14    LOS: 2  PCCM PROGRESS NOTE  History of Present Illness:  66 y/o [place] M, current smoker, with PMH of HTN, HLD, and prostate cancer s/p treatment (pt thinks radiation seeds) admitted on 2/27 for scheduled posterior lumbar fusion of L3-L4, L4-5 diskectomy.  3/1 patient noted to have ronchi, fever and cxr concerning for PNA.  PCCM consulted for PNA 3/2.    Lines / Drains: None  Cultures: 3/1 BCx2>>> 3/1 Resp culture>>>  Antibiotics: 3/1 Levaquin (? PNA)>>>  Tests / Events: 3/1  CXR>>> Pulmonary vascular congestion, atelectasis  Past Medical History  Diagnosis Date  . Hypertension   . Hyperlipidemia   . Prostate ca    Past Surgical History  Procedure Date  . No past surgeries    Prior to Admission medications   Medication Sig Start Date End Date Taking? Authorizing Provider  hydrochlorothiazide (HYDRODIURIL) 25 MG tablet Take 25 mg by mouth daily.   Yes Historical Provider, MD  simvastatin (ZOCOR) 20 MG tablet Take 20 mg by mouth every evening.   Yes Historical Provider, MD  Tamsulosin HCl (FLOMAX) 0.4 MG CAPS Take 0.4 mg by mouth daily.   Yes Historical Provider, MD   Allergies No Known Allergies  Family History No family history on file.  Social History  reports that he has been smoking Cigarettes.  He has a 50 pack-year smoking history. He does not have any smokeless tobacco history on file. He reports that he does not drink alcohol or use illicit drugs.  Review Of Systems:  Gen: Denies weight change, fatigue, night sweats.  Indicates fever, chills HEENT: Denies blurred vision, double vision, hearing loss, tinnitus, sinus congestion, rhinorrhea, sore throat, neck stiffness, dysphagia PULM: Denies shortness of breath, hemoptysis, Indicates wheezing, cough, sputum production CV: Denies chest pain, edema, orthopnea, paroxysmal nocturnal dyspnea, palpitations GI: Denies abdominal pain, nausea, vomiting, diarrhea,  hematochezia, melena, constipation, change in bowel habits GU: Denies dysuria, hematuria, polyuria, oliguria, urethral discharge.  C/o difficulty voiding.  Endocrine: Denies hot or cold intolerance, polyuria, polyphagia or appetite change Derm: Denies rash, dry skin, scaling or peeling skin change Heme: Denies easy bruising, bleeding, bleeding gums Neuro: Denies headache, numbness, weakness, slurred speech, loss of memory or consciousness  Vital Signs: Filed Vitals:   10/12/11 0634 10/12/11 0800 10/12/11 1026 10/12/11 1315  BP: 105/62 100/64  105/67  Pulse: 98  90   Temp: 102.8 F (39.3 C) 99.1 F (37.3 C) 99.3 F (37.4 C)   TempSrc: Oral  Oral   Resp: 18  18   Height:      Weight:      SpO2: 96%  90%    Physical Examination: General: wdwn elderly male in NAD Neuro: AAOx4, mild sedation, interacts appropriately, MAE CV: s1s2 rrr, no m/r/g, no JVD PULM: resp's even/non-labored, lungs bilaterally coarse, few scattered rhonchi, exp wheezes GI: round / soft, bsx4 active Extremities:  Warm / dry, no edema.   Labs     CBC  Lab 10/11/11 1117  HGB 10.4*  HCT 31.1*  WBC 9.7  PLT 183   BMET  Lab 10/11/11 1117  NA 140  K 3.5  CL 102  CO2 31  GLUCOSE 94  BUN 10  CREATININE 1.13  CALCIUM 8.3*  MG --  PHOS --   Radiology: 3/1 CXR>>>Acute on chronic interstitial thickening. Favor mild pulmonary venous congestion. Low lung volumes with patchy left base air space disease.   Assessment and Plan:  Postop  fever, atelectasis vs. pneumonia (CAP) Encephalopathy, likely secondary to opioids / benzodiazepines Difficulty voiding Dehydration -Levaquin IV for now -respiratory & blood cultures, PCT, lactate -CXR in AM -continue incentive spirometry -limit opioids / benzodiazepines -check ABG -hold HCTZ -IVF bolus -resume Flomax  Canary Brim, NP-C [facility] Pgr: [phone]  10/12/2011, 1:58 PM   Patient examined, records reviewed, assessment and  plan as above.  Orlean Bradford, M.D. Pulmonary and Critical Care Medicine [facility] Cell: [phone] Pager: [phone]

## 2011-10-12 NOTE — Progress Notes (Signed)
CSW received consult for SNF. Currently, PT is recommending home health. RNCM is aware and following. Pt's wife would like a Administrator, arts interpreter, RNCM is aware. CSW is signing off as dispo is for pt to discharge home with home health services. Please reconsult if a clinical social work needs arises prior to discharge.   Dede Query, MSW, Theresia Majors [phone]

## 2011-10-12 NOTE — Progress Notes (Signed)
Occupational Therapy Treatment Patient Details Name: Rhydian Baldi MRN: [id number] DOB: 08-Sep-1945 Today's Date: 10/12/2011  OT Assessment/Plan OT Assessment/Plan Comments on Treatment Session: Pt extremely lethargic this PM and limited by fatigue and pain.  Pt's O2 levels fluctuated between 87-95% during sit to stand activity.  Pt with c/o of dizziness in standing but unable to tolerate standing long enough for BP reading.  RN made aware.  Educated and instructed pt on use of incentive spirometer.  Pt able to complete 10 reps of breathing exercise with incentive spirometer correctly.   OT Plan: Discharge plan remains appropriate OT Frequency: Min 2X/week Follow Up Recommendations: Home health OT Equipment Recommended: 3 in 1 bedside comode;Other (comment) (tub DME to be determined) OT Goals ADL Goals Pt Will Transfer to Toilet: with modified independence;with DME;3-in-1;Ambulation ADL Goal: Toilet Transfer - Progress: Progressing toward goals Miscellaneous OT Goals Miscellaneous OT Goal #1: Pt will perform bed mobility with mod I in prep for EOB ADLs. OT Goal: Miscellaneous Goal #1 - Progress: Progressing toward goals  OT Treatment Precautions/Restrictions  Precautions Precautions: Back Required Braces or Orthoses: Yes Spinal Brace: Lumbar corset;Applied in sitting position Restrictions Weight Bearing Restrictions: No   ADL ADL Toilet Transfer: Simulated;+2 Total assistance;Comment for patient % (60%) Toilet Transfer Details (indicate cue type and reason): Pt side stepped to [facility] with max VC and tactile cues for sequencing. Toilet Transfer Method: Other (comment) (side step) Acupuncturist: Other (comment) (bed) Equipment Used: Rolling walker ADL Comments: Pt donned back brace with max assist sitting EOB. Mobility  Bed Mobility Bed Mobility: Yes Rolling Right: 3: Mod assist;With rail Rolling Right Details (indicate cue type and reason): assist to initiate and complete  roll with VC for proper technique to avoid twisting Right Sidelying to Sit: 2: Max assist;HOB flat;With rails Right Sidelying to Sit Details (indicate cue type and reason): assist to elevate trunk and bil. LE OOB; VC for proper technique Sitting - Scoot to Edge of Bed: 4: Min assist Sitting - Scoot to [place] of Bed Details (indicate cue type and reason): with use of pad Transfers Transfers: Yes Sit to Stand: 1: +2 Total assist;From bed;With upper extremity assist;Other (comment) (60%) Sit to Stand Details (indicate cue type and reason): assist to initiate transfer with VC for technique and hand placement Stand to Sit: 3: Mod assist;To bed;With upper extremity assist Stand to Sit Details: verbal and tactile cues to reach back for bed before beginning descent. Exercises    End of Session OT - End of Session Equipment Utilized During Treatment: Gait belt;Back brace Activity Tolerance: Patient limited by fatigue;Patient limited by pain Patient left: in bed;with call bell in reach;with family/visitor present Nurse Communication: Mobility status for transfers;Other (comment) (O2 levels) General Behavior During Session: Lethargic Cognition: WFL for tasks performed  Cipriano Mile OTR/L Pager [phone] Office [phone]

## 2011-10-12 NOTE — Progress Notes (Signed)
Physical Therapy Treatment Patient Details Name: Phat Dalton MRN: [id number] DOB: 03-23-1946 Today's Date: 10/12/2011  PT Assessment/Plan  PT - Assessment/Plan Comments on Treatment Session: Pt limited today due to lethargic and decrease SaO2 to 87% with standing and 2L O2.  Pt continue to c/o dizziness and was returned to supine.  Educated and pt performed spirometer 10 times an hour.  RN notified about decrease in O2 and aware of lethargy.  Will continue to follow and encourage increase mobility. PT Plan: Discharge plan remains appropriate;Frequency remains appropriate PT Frequency: Min 5X/week Follow Up Recommendations: Home health PT Equipment Recommended: Rolling walker with 5" wheels PT Goals  Acute Rehab PT Goals PT Goal Formulation: With patient Time For Goal Achievement: 7 days Pt will Roll Supine to Right Side: with modified independence PT Goal: Rolling Supine to Right Side - Progress: Progressing toward goal Pt will go Supine/Side to Sit: with modified independence PT Goal: Supine/Side to Sit - Progress: Progressing toward goal Pt will go Sit to Supine/Side: with modified independence PT Goal: Sit to Supine/Side - Progress: Progressing toward goal Pt will go Sit to Stand: with modified independence PT Goal: Sit to Stand - Progress: Progressing toward goal Pt will go Stand to Sit: with modified independence PT Goal: Stand to Sit - Progress: Progressing toward goal Pt will Ambulate: >150 feet;with modified independence;with rolling walker PT Goal: Ambulate - Progress: Progressing toward goal  PT Treatment Precautions/Restrictions  Precautions Precautions: Back Precaution Booklet Issued: Yes (comment) Precaution Comments: Reviewed 3/3 back precautions. Required Braces or Orthoses: Yes Spinal Brace: Lumbar corset;Applied in sitting position Restrictions Weight Bearing Restrictions: No Mobility (including Balance) Bed Mobility Bed Mobility: Yes Rolling Right: 3: Mod  assist;With rail Rolling Right Details (indicate cue type and reason): (A) to initiate and complete roll with cues for proper technique and sequencing to prevent twisting. Right Sidelying to Sit: 2: Max assist;HOB flat;With rails Right Sidelying to Sit Details (indicate cue type and reason): (A) to elevate trunk OOB and LE OOB with cues for proper technique Sitting - Scoot to Edge of Bed: 4: Min assist Sitting - Scoot to [place] of Bed Details (indicate cue type and reason): (A) to advance hips to EOB with cues for proper technique Sit to Supine: 1: +2 Total assist;Patient percentage (comment);HOB flat (Pt 60%) Sit to Supine - Details (indicate cue type and reason): (A) to slowly descend trunk and elevate LE into bed Transfers Transfers: Yes Sit to Stand: 1: +2 Total assist;From bed;With upper extremity assist;Other (comment) (60%) Sit to Stand Details (indicate cue type and reason):   assist to initiate transfer with VC for technique and hand placement  Stand to Sit: 3: Mod assist;To bed;With upper extremity assist Stand to Sit Details:   verbal and tactile cues to reach back for bed before beginning descent.  Ambulation/Gait Ambulation/Gait: Yes Ambulation/Gait Assistance: 1: +2 Total assist;Patient percentage (comment) (pt 70%) Ambulation/Gait Assistance Details (indicate cue type and reason): Pt able to take 3-4 side steps to [facility] with cues for technique.  (A) to maneuver RW and maintain upright posture. Ambulation Distance (Feet): 4 Feet (side steps) Assistive device: Rolling walker Gait Pattern: Step-to pattern  Posture/Postural Control Posture/Postural Control: No significant limitations Balance Balance Assessed: Yes Static Sitting Balance Static Sitting - Balance Support: Feet supported Static Sitting - Level of Assistance: 4: Min assist Static Sitting - Comment/# of Minutes: Intermittent min (A) for safety with max (A) to donn back brace Exercise    End of Session PT - End of  Session Equipment Utilized During Treatment: Gait belt;Back brace Activity Tolerance: Patient limited by fatigue Patient left: with call bell in reach;in bed;with family/visitor present Nurse Communication: Mobility status for transfers;Mobility status for ambulation General Behavior During Session: Lethargic Cognition: WFL for tasks performed  Scott Cruz 10/12/2011, 2:06 PM Pager:  [phone]

## 2011-10-12 NOTE — Progress Notes (Signed)
  Pt unable to void during night. Bladder scan at 0630 showed  725cc. I&O cath done; resulted in 800cc clear, yellow urine. Will continue to monitor.

## 2011-10-12 NOTE — Progress Notes (Signed)
Pt. remains drowsy, now with temp 102.8 ax.; Tylenol given, still unable to void; placed foley to gravity. Pt. With cloudy urine and bacteria visible. Increased wheezing with sats 94% 2L. Dr. Craige Cotta notified of this, will place new orders.

## 2011-10-12 NOTE — Progress Notes (Signed)
OT Cancellation Note  Treatment cancelled today due to patient receiving procedure.  Will re-attempt when pt returns to room.  Thanks!  10/12/2011 Cipriano Mile OTR/L Pager [phone] Office [phone]

## 2011-10-12 NOTE — Progress Notes (Signed)
Patient ID: Scott Cruz, male   DOB: 1946-01-21, 66 y.o.   MRN: [id number] Febril. Bilateral ronchii. C/o incisional pain. No weakness. Plan  D/c morphine. Xray chest. Respiratory consult

## 2011-10-12 NOTE — Significant Event (Signed)
Pt noted to have wheezing.  Will give prn neb tx with albuterol.    Foley placed with dirty appearing urine.  Will send for u/a and urine culture.  Coralyn Helling, MD 10/12/2011, 6:34 PM [phone]

## 2011-10-13 ENCOUNTER — Inpatient Hospital Stay (HOSPITAL_COMMUNITY): Payer: PRIVATE HEALTH INSURANCE

## 2011-10-13 NOTE — Progress Notes (Signed)
BP 94/58  Pulse 81  Temp(Src) 98.1 F (36.7 C) (Oral)  Resp 20  Ht 5\' 4"  (1.626 m)  Wt 70.8 kg (156 lb 1.4 oz)  BMI 26.79 kg/m2  SpO2 97% Alert, following commands. Still with a great deal of pain in lower back Moving lower extremities well. Wound clean. Dressing stained with sanguineous drainage. UA clean Leave foley in place for now.

## 2011-10-13 NOTE — Progress Notes (Signed)
History of Present Illness:  66 y/o [place] M, current smoker, with PMH of HTN, HLD, and prostate cancer s/p treatment (pt thinks radiation seeds) admitted on 2/27 for scheduled posterior lumbar fusion of L3-L4, L4-5 diskectomy. 3/1 patient noted to have ronchi, fever and cxr concerning for PNA. PCCM consulted for PNA 3/2.   Subjective: He is lethargic but c/o pains in shoulders, not chest.  Wife in room with no concern expressed. Nurse comments on drowsiness after single Vicodin and will discuss w/ NSGY.  Objective: Vital signs in last 24 hours: Temp:  [97.9 F (36.6 C)-102.8 F (39.3 C)] 100.6 F (38.1 C) (03/02 1050) Pulse Rate:  [81-99] 89  (03/02 1050) Resp:  [18-20] 18  (03/02 1050) BP: (94-121)/(58-70) 116/61 mmHg (03/02 1050) SpO2:  [91 %-100 %] 91 % (03/02 1050)  Physical Examination:  General: wdwn elderly male in NAD. Drowsy, passive, supine, non-toxic Neuro: AAOx4, mild sedation, interacts verbally 1-2 word answers, MAE  CV: s1s2 rrr, no m/r/g, no JVD  PULM: resp's even/non-labored, lungs bilaterally coarse R>L, few scattered rhonchi, no wheeze, unlabored GI: round / soft, bsx4 active  Extremities: Warm / dry, no edema.    Lab Results:  [facility] 10/11/11 1117  WBC 9.7  HGB 10.4*  HCT 31.1*  PLT 183   BMET  Basename 10/11/11 1117  NA 140  K 3.5  CL 102  CO2 31  GLUCOSE 94  BUN 10  CREATININE 1.13  CALCIUM 8.3*    Studies/Results: Dg Chest 2 View  10/12/2011  *RADIOLOGY REPORT*  Clinical Data: Fever.  Cough.  Shortness of breath and hypertension.Recently postop for lumbar spine surgery.  CHEST - 2 VIEW  Comparison: 10/03/2011  Findings: Midline trachea.  Borderline cardiomegaly, accentuated by low lung volumes. No pleural effusion or pneumothorax.  There is chronic pulmonary interstitial thickening.  This is felt to be slightly increased, suggesting a superimposed acute or subacute component.  More focal opacity at the left lung base.  IMPRESSION:  1.   Acute on chronic interstitial thickening.  Favor mild pulmonary venous congestion. 2.  Low lung volumes with patchy left base air space disease.  This could represent atelectasis or early infection.  Consider short- term radiographic follow-up.  Original Report Authenticated By: Consuello Bossier, M.D.   Dg Chest Port 1 View  10/13/2011  *RADIOLOGY REPORT*  Clinical Data: Shortness of breath.  Postop lumbar spine surgery.  PORTABLE CHEST - 1 VIEW  Comparison: 10/12/2011  Findings: Patchy opacity within the right lower lung is unchanged - question atelectasis versus mild pneumonia. The cardiomediastinal silhouette is unremarkable. There is no evidence of pleural effusion or pneumothorax. No acute bony abnormalities are present.  IMPRESSION: Unchanged mild patchy opacity within the right lower lung - question atelectasis versus early / mild pneumonia.  Original Report Authenticated By: Rosendo Gros, M.D.    Medications: reviewec  Assessment/Plan: 1) Mild pneumonia RLL- Levaquin started 3/1. Cx pending.   Plan: CXR tomorrow 2) Dehydration 3) Encephalopathy/ sedation- just sensitive to opiates?- Nurse to discuss w/ Neurosurgery 4) Difficulty voiding- hx XRT for prostate Ca -Foley to be reconsidered daily.  LOS: 3 days   Keyera Hattabaugh D 10/13/2011, 10:55 AM

## 2011-10-14 ENCOUNTER — Inpatient Hospital Stay (HOSPITAL_COMMUNITY): Payer: PRIVATE HEALTH INSURANCE

## 2011-10-14 DIAGNOSIS — G9340 Encephalopathy, unspecified: Secondary | ICD-10-CM

## 2011-10-14 DIAGNOSIS — E860 Dehydration: Secondary | ICD-10-CM

## 2011-10-14 DIAGNOSIS — J159 Unspecified bacterial pneumonia: Secondary | ICD-10-CM

## 2011-10-14 LAB — URINE CULTURE
Colony Count: NO GROWTH
Culture  Setup Time: 201303020143

## 2011-10-14 MED ORDER — DOCUSATE SODIUM 100 MG PO CAPS
100.0000 mg | ORAL_CAPSULE | Freq: Two times a day (BID) | ORAL | Status: DC
  Administered 2011-10-14 – 2011-10-16 (×6): 100 mg via ORAL
  Filled 2011-10-14 (×4): qty 1

## 2011-10-14 MED ORDER — MAGNESIUM CITRATE PO SOLN
1.0000 | Freq: Once | ORAL | Status: AC
  Administered 2011-10-14: 1 via ORAL
  Filled 2011-10-14: qty 296

## 2011-10-14 NOTE — Progress Notes (Signed)
Physical Therapy Treatment Patient Details Name: Scott Cruz MRN: [id number] DOB: April 28, 1946 Today's Date: 10/14/2011  PT Assessment/Plan  PT - Assessment/Plan Comments on Treatment Session: Increased activity tolerance today with pt ambulating to chair. Still with reports of dizziness with standing up, with deep breathing and eye fixation on target pt reported decreased dizziness and was able to continue with gait to chair. O2 sats greater than/equal to 96% on 2 lpm throughtout session. PT Plan: Discharge plan remains appropriate;Frequency remains appropriate PT Frequency: Min 5X/week Follow Up Recommendations: Home health PT Equipment Recommended: Rolling walker with 5" wheels PT Goals  Acute Rehab PT Goals PT Goal: Rolling Supine to Right Side - Progress: Progressing toward goal PT Goal: Supine/Side to Sit - Progress: Progressing toward goal PT Goal: Sit to Stand - Progress: Progressing toward goal PT Goal: Stand to Sit - Progress: Progressing toward goal PT Goal: Ambulate - Progress: Progressing toward goal  PT Treatment Precautions/Restrictions  Precautions Precautions: Back Precaution Booklet Issued: Yes (comment) Precaution Comments: Re-educated pt/spouse on 3/3 back precautions. Required Braces or Orthoses: Yes Spinal Brace: Lumbar corset;Applied in sitting position (total assist to don brace today at edge of bed) Restrictions Weight Bearing Restrictions: No Mobility (including Balance) Bed Mobility Rolling Right: 4: Min assist;With rail Right Sidelying to Sit: 4: Min assist;HOB flat;With rails Sitting - Scoot to Edge of Bed: 5: Supervision Transfers Sit to Stand: 1: +2 Total assist;With upper extremity assist;From elevated surface;From bed;Other (comment) (pt= 75%) Stand to Sit: 4: Min assist;With upper extremity assist;With armrests;To chair/3-in-1 Ambulation/Gait Ambulation/Gait Assistance: 4: Min assist Assistive device: Rolling walker Gait Pattern: Step-through  pattern;Decreased step length - right;Decreased step length - left;Decreased stride length;Shuffle  Posture/Postural Control Posture/Postural Control: No significant limitations Static Sitting Balance Static Sitting - Balance Support: Feet supported (intermittent UE support on bed) Static Sitting - Level of Assistance: 5: Stand by assistance    End of Session PT - End of Session Equipment Utilized During Treatment: Gait belt;Back brace Activity Tolerance: Patient tolerated treatment well;Treatment limited secondary to medical complications (Comment) (dizzy with intital stdg, cleared enougth to amb to chair) Patient left: in chair;with call bell in reach;with family/visitor present Nurse Communication: Mobility status for transfers;Mobility status for ambulation General Behavior During Session: [facility] for tasks performed Cognition: [facility] for tasks performed  Sallyanne Kuster 10/14/2011, 9:36 AM  Sallyanne Kuster, PTA Office- [phone]

## 2011-10-14 NOTE — Progress Notes (Signed)
History of Present Illness:  66 y/o [place] M, current smoker, with PMH of HTN, HLD, and prostate cancer s/p treatment (pt thinks radiation seeds) admitted on 2/27 for scheduled posterior lumbar fusion of L3-L4, L4-5 diskectomy. 3/1 patient noted to have ronchi, fever and cxr concerning for PNA. PCCM consulted for PNA 3/2.   Subjective: Family in room. Has been ambulating. Asleep and snoring when I entered, but woke easily to more alert than yesterday. C/o stiffness and pain in shoulders. Denies chest pain. Weak but not SOB.  Objective: Vital signs in last 24 hours: Temp:  [97.9 F (36.6 C)-100.1 F (37.8 C)] 97.9 F (36.6 C) (03/03 0600) Pulse Rate:  [77-86] 77  (03/03 0600) Resp:  [18] 18  (03/03 0600) BP: (96-115)/(51-68) 109/61 mmHg (03/03 0600) SpO2:  [92 %-98 %] 98 % (03/03 0600)  Physical Examination:  General: wdwn elderly male in NAD.  passive, supine, non-toxic Neuro: AAOx4, CV: s1s2 rrr, no m/r/g, no JVD  PULM: resp's even/non-labored. Sounds clearer anteriorly. GI: round / soft, bsx4 active  Extremities: Warm / dry, no edema. No crepitus in shoulders   Lab Results: No results found for this basename: WBC:2,HGB:2,HCT:2,PLT:2 in the last 72 hours BMET No results found for this basename: NA:2,K:2,CL:2,CO2:2,GLUCOSE:2,BUN:2,CREATININE:2,CALCIUM:2 in the last 72 hours  Studies/Results: Dg Chest Port 1 View  10/14/2011  *RADIOLOGY REPORT*  Clinical Data: Right lower lobe pneumonia after lumbar surgery.  PORTABLE CHEST - 1 VIEW  Comparison: 10/13/2011  Findings: Cardiomediastinal silhouette is within normal limits accounting for the portable position of the patient.  There is mild prominence of interstitial markings.  Patchy densities are identified at the lung bases bilaterally, right greater than left.  IMPRESSION: Bibasilar atelectasis or infiltrates.  Original Report Authenticated By: Patterson Hammersmith, M.D.   Dg Chest Port 1 View  10/13/2011  *RADIOLOGY REPORT*   Clinical Data: Shortness of breath.  Postop lumbar spine surgery.  PORTABLE CHEST - 1 VIEW  Comparison: 10/12/2011  Findings: Patchy opacity within the right lower lung is unchanged - question atelectasis versus mild pneumonia. The cardiomediastinal silhouette is unremarkable. There is no evidence of pleural effusion or pneumothorax. No acute bony abnormalities are present.  IMPRESSION: Unchanged mild patchy opacity within the right lower lung - question atelectasis versus early / mild pneumonia.  Original Report Authenticated By: Rosendo Gros, M.D.    Medications: reviewec  Assessment/Plan: 1) Mild pneumonia RLL- Levaquin started 3/1. Cx pending.   Plan: F/u CXR, CBC tomorrow,  2) Dehydration. Plan BMET 3) Encephalopathy/ sedation- just sensitive to opiates?- Nurse to discuss w/ Neurosurgery 4) Difficulty voiding- hx XRT for prostate Ca -Foley to be reconsidered daily.  LOS: 4 days   Ameris Akamine D 10/14/2011, 11:32 AM

## 2011-10-14 NOTE — Progress Notes (Signed)
Physical Therapy Treatment Patient Details Name: Scott Cruz MRN: [id number] DOB: 30-Oct-1945 Today's Date: 10/14/2011  PT Assessment/Plan  PT - Assessment/Plan Comments on Treatment Session: Great progress made today with hallway ambulation and less assistance needed with mobility. Pt still reports mild dizziness with mobility, however pt is able to increase participation with therapy despite dizziness. PT Plan: Discharge plan remains appropriate;Frequency remains appropriate PT Frequency: Min 5X/week Follow Up Recommendations: Home health PT Equipment Recommended: Rolling walker with 5" wheels PT Goals  Acute Rehab PT Goals PT Goal: Sit to Supine/Side - Progress: Progressing toward goal PT Goal: Sit to Stand - Progress: Progressing toward goal PT Goal: Stand to Sit - Progress: Progressing toward goal PT Goal: Ambulate - Progress: Progressing toward goal  PT Treatment Precautions/Restrictions  Precautions Precautions: Back Precaution Booklet Issued: Yes (comment) Precaution Comments: Re-educated pt and family member on 3/3 back precautions through out session. pt able to recall 2/3 at end of session. Required Braces or Orthoses: Yes Spinal Brace: Lumbar corset;Applied in sitting position (on upon arrival to room. supv. for pt to doff at edge of bed) Restrictions Weight Bearing Restrictions: No Mobility (including Balance) Bed Mobility Sit to Supine: 4: Min assist;HOB flat Sit to Supine - Details (indicate cue type and reason): min assist with cues on technique for sitting to right sidelying and then logrolling onto back. most assist for elevation of legs, pt able to lower trunk safely and roll self over safely with cues. no rails used. Transfers Sit to Stand: 4: Min assist;From chair/3-in-1;With armrests;With upper extremity assist Sit to Stand Details (indicate cue type and reason): cues for hand placement for safety with transfers. min assist to complete standing with cues for  hip/knee ext once pt was partially standing. Stand to Sit: 4: Min assist;To bed;With upper extremity assist Stand to Sit Details: min assit to control descent onto bed surface with cues for hand placement and to back up all the way to the surface with both legs before sitting down for safety. Ambulation/Gait Ambulation/Gait Assistance: 4: Min assist Ambulation/Gait Assistance Details (indicate cue type and reason): constant cues for upright posture and min cues for RW use/navigation in hallway with gait. demo'd short, reciprocal steps with normal heel to toe progression Ambulation Distance (Feet): 150 Feet Assistive device: Rolling walker Gait Pattern: Step-through pattern;Decreased stride length;Decreased step length - right;Decreased step length - left;Trunk flexed  Posture/Postural Control Posture/Postural Control: No significant limitations Exercise    End of Session PT - End of Session Equipment Utilized During Treatment: Gait belt;Back brace Activity Tolerance: Patient tolerated treatment well Patient left: in bed;with call bell in reach;with family/visitor present Nurse Communication: Mobility status for transfers;Mobility status for ambulation General Behavior During Session: [facility] for tasks performed Cognition: [facility] for tasks performed  Sallyanne Kuster 10/14/2011, 2:02 PM  Sallyanne Kuster, PTA Office- [phone] Weekend Pager- [phone]

## 2011-10-14 NOTE — Progress Notes (Signed)
Patient ID: Scott Cruz, male   DOB: April 27, 1946, 66 y.o.   MRN: [id number] BP 109/61  Pulse 77  Temp(Src) 97.9 F (36.6 C) (Oral)  Resp 18  Ht 5\' 4"  (1.626 m)  Wt 70.8 kg (156 lb 1.4 oz)  BMI 26.79 kg/m2  SpO2 98% Alert, oriented x 4 5/5 strength in lower extremities Wound is weeping serosanguineous drainage. Will change dressings accordingly. Continue working on pulmonary system.  Up walking. Constipated, will give stool softener and MgCitrate.

## 2011-10-15 ENCOUNTER — Inpatient Hospital Stay (HOSPITAL_COMMUNITY): Payer: PRIVATE HEALTH INSURANCE

## 2011-10-15 LAB — BASIC METABOLIC PANEL
BUN: 10 mg/dL (ref 6–23)
Calcium: 8.4 mg/dL (ref 8.4–10.5)
Creatinine, Ser: 0.73 mg/dL (ref 0.50–1.35)
GFR calc Af Amer: >90 mL/min (ref >90)
GFR calc non Af Amer: >90 mL/min (ref >90)

## 2011-10-15 LAB — DIFFERENTIAL
Basophils Absolute: 0 10*3/uL (ref 0.0–0.1)
Basophils Relative: 0 % (ref 0–1)
Eosinophils Absolute: 0.2 10*3/uL (ref 0.0–0.7)
Eosinophils Relative: 3 % (ref 0–5)
Lymphs Abs: 1.4 10*3/uL (ref 0.7–4.0)

## 2011-10-15 LAB — CBC
HCT: 27.8 % — ABNORMAL LOW (ref 39.0–52.0)
MCH: 20.5 pg — ABNORMAL LOW (ref 26.0–34.0)
MCHC: 33.5 g/dL (ref 30.0–36.0)
MCV: 61.4 fL — ABNORMAL LOW (ref 78.0–100.0)
RDW: 16 % — ABNORMAL HIGH (ref 11.5–15.5)

## 2011-10-15 MED ORDER — LEVOFLOXACIN 750 MG PO TABS
750.0000 mg | ORAL_TABLET | Freq: Every day | ORAL | Status: DC
  Administered 2011-10-15 – 2011-10-16 (×2): 750 mg via ORAL
  Filled 2011-10-15 (×3): qty 1

## 2011-10-15 MED ORDER — POTASSIUM CHLORIDE 20 MEQ PO PACK
20.0000 meq | PACK | Freq: Three times a day (TID) | ORAL | Status: AC
  Administered 2011-10-15 (×3): 20 meq via ORAL
  Filled 2011-10-15 (×3): qty 1

## 2011-10-15 NOTE — Progress Notes (Signed)
Patient ID: Scott Cruz, male   DOB: 1946/04/05, 66 y.o.   MRN: [id number] Pain left shoulder,more awake, temperature down. Minimal lumbar drainage. Potassium down to 3. See orders.

## 2011-10-15 NOTE — Progress Notes (Signed)
Patient: Scott Cruz DOB: 1945-09-16 MRN: [id number] Date of service: 10/15/2011    [place] [facility]  Brief patient profile:  66 y/o [place] M, current smoker, with PMH of HTN, HLD, and prostate cancer s/p treatment (pt thinks radiation seeds) admitted on 2/27 for scheduled posterior lumbar fusion of L3-L4, L4-5 diskectomy. 3/1 patient noted to have ronchi, fever and cxr concerning for PNA. PCCM consulted for PNA 3/2.   Subjective: No acute change.  Per nsg doing well with ambulation.  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 No new c/o.   Objective: Vital signs in last 24 hours: Temp:  [97.6 F (36.4 C)-98.8 F (37.1 C)] 98.2 F (36.8 C) (03/04 0600) Pulse Rate:  [77-82] 78  (03/04 0600) Resp:  [16-20] 16  (03/04 0600) BP: (107-114)/(60-66) 114/63 mmHg (03/04 0600) SpO2:  [90 %-97 %] 93 % (03/04 0600) on 2lpm  Physical Examination:  General: wdwn elderly male in NAD.  passive, supine Neuro: asleep, arouses to voice, MAE CV: s1s2 rrr, no m/r/g, no JVD  PULM: resp's even/non-labored on RA, diminished bases otherwise CTA GI: round / soft, bsx4 active  Extremities: Warm / dry, no edema  Lab Results:  [facility] 10/15/11 0535  WBC 6.9  HGB 9.3*  HCT 27.8*  PLT 265   BMET  Basename 10/15/11 0535  NA 141  K 3.0*  CL 105  CO2 29  GLUCOSE 115*  BUN 10  CREATININE 0.73  CALCIUM 8.4    Studies/Results: Dg Chest Port 1 View  10/15/2011  *RADIOLOGY REPORT*  Clinical Data: Pneumonia.  PORTABLE CHEST - 1 VIEW  Comparison: 10/14/2011  Findings: Mild cardiomegaly.  Vascular congestion.  Right base atelectasis.  No confluent opacity on the left.  No overt edema. No effusions.  IMPRESSION: Cardiomegaly with vascular congestion and right base atelectasis.  Original Report Authenticated By: Cyndie Chime, M.D.   Dg Chest Port 1 View  10/14/2011  *RADIOLOGY REPORT*  Clinical Data: Right lower lobe pneumonia after lumbar surgery.  PORTABLE CHEST - 1 VIEW  Comparison: 10/13/2011   Findings: Cardiomediastinal silhouette is within normal limits accounting for the portable position of the patient.  There is mild prominence of interstitial markings.  Patchy densities are identified at the lung bases bilaterally, right greater than left.  IMPRESSION: Bibasilar atelectasis or infiltrates.  Original Report Authenticated By: Patterson Hammersmith, M.D.    Medications: reviewec  Assessment/Plan: 1) Mild pneumonia RLL- Levaquin started 3/1. Cx pending.  No distress.  On RA.  CXR improved.   Plan:  Change abx to PO - plan complete 7 days F/u cxr  Follow cultures Pulmonary hygiene  2) Hypokalemia   Plan  Replaced per primary   3) Encephalopathy/ sedation- just sensitive to opiates?  Improved.  Also per nsg he sleeps daytime and is awake all night (works nights) PLAN -  Monitor Minimize narcs/sedation  4) Difficulty voiding- hx XRT for prostate Ca  PLAN -  Trial d/c foley today per primary   If cultures remains neg would recommend complete 10 days levaquin PO (stop date 3/10).  PCCM signing off please call back if needed.   Danford Bad, NP 10/15/2011, 9:36 AM   Pt independently  seen and examined and available cxr's reviewed and I agree with above findings/ imp/ plan   Sandrea Hughs, MD Pulmonary and Critical Care Medicine [facility] Cell [phone]

## 2011-10-15 NOTE — Progress Notes (Signed)
Physical Therapy Treatment Patient Details Name: Scott Cruz MRN: [id number] DOB: 05-27-1946 Today's Date: 10/15/2011  PT Assessment/Plan  PT - Assessment/Plan Comments on Treatment Session: Continues to progress with mobility with encouragement.  Continues to c/o dizziness and decreased Hgb and low BP. PT Plan: Discharge plan remains appropriate;Frequency remains appropriate PT Frequency: Min 5X/week Follow Up Recommendations: Home health PT Equipment Recommended: Rolling walker with 5" wheels PT Goals  Acute Rehab PT Goals PT Goal: Rolling Supine to Right Side - Progress: Progressing toward goal PT Goal: Supine/Side to Sit - Progress: Progressing toward goal PT Goal: Sit to Supine/Side - Progress: Progressing toward goal PT Goal: Sit to Stand - Progress: Progressing toward goal PT Goal: Stand to Sit - Progress: Progressing toward goal PT Goal: Ambulate - Progress: Progressing toward goal  PT Treatment Precautions/Restrictions  Precautions Precautions: Back Precaution Booklet Issued: Yes (comment) Precaution Comments: Reviewed back precautions Required Braces or Orthoses: Yes Spinal Brace: Lumbar corset;Applied in sitting position Restrictions Weight Bearing Restrictions: No Mobility (including Balance) Bed Mobility Bed Mobility: Yes Rolling Right: 5: Supervision;With rail Rolling Right Details (indicate cue type and reason): Verbal cues to maintain precautions Right Sidelying to Sit: 5: Supervision;HOB flat;With rails Sitting - Scoot to Edge of Bed: 5: Supervision Transfers Transfers: Yes Sit to Stand: 4: Min assist;With upper extremity assist;From bed Sit to Stand Details (indicate cue type and reason): Guard assist for balance Stand to Sit: 4: Min assist;With upper extremity assist;With armrests;To chair/3-in-1 Stand to Sit Details: Guard assist Ambulation/Gait Ambulation/Gait: Yes Ambulation/Gait Assistance: 4: Min assist Ambulation/Gait Assistance Details (indicate  cue type and reason): Cues for upright posture and to navigate. Assistive device: Rolling walker Gait Pattern: Step-through pattern;Decreased stride length;Trunk flexed Gait velocity: slow and cautious Stairs: No Wheelchair Mobility Wheelchair Mobility: No  Posture/Postural Control Posture/Postural Control: No significant limitations Balance Balance Assessed: Yes Static Sitting Balance Static Sitting - Balance Support: Feet supported Static Sitting - Level of Assistance: 5: Stand by assistance Exercise    End of Session PT - End of Session Equipment Utilized During Treatment: Gait belt;Back brace Activity Tolerance: Patient tolerated treatment well Patient left: in chair;with call bell in reach;with family/visitor present Nurse Communication: Mobility status for transfers;Mobility status for ambulation General Behavior During Session: [facility] for tasks performed Cognition: [facility] for tasks performed  Newell Coral 10/15/2011, 1:53 PM  Newell Coral, PTA [phone]

## 2011-10-16 DIAGNOSIS — J159 Unspecified bacterial pneumonia: Secondary | ICD-10-CM

## 2011-10-16 DIAGNOSIS — G9340 Encephalopathy, unspecified: Secondary | ICD-10-CM

## 2011-10-16 DIAGNOSIS — E860 Dehydration: Secondary | ICD-10-CM

## 2011-10-16 MED FILL — Sodium Chloride Irrigation Soln 0.9%: Qty: 3000 | Status: AC

## 2011-10-16 MED FILL — Sodium Chloride IV Soln 0.9%: INTRAVENOUS | Qty: 1000 | Status: AC

## 2011-10-16 MED FILL — Heparin Sodium (Porcine) Inj 1000 Unit/ML: INTRAMUSCULAR | Qty: 30 | Status: AC

## 2011-10-16 NOTE — Progress Notes (Signed)
Patient ID: Scott Cruz, male   DOB: [birth year], 66 y.o.   MRN: [id number] MORE AWAKE. C/O OF INCISIONAL PAIN. Wound dry. No sob. Continue as per pulmonologist. Rehabilitation medicine to see him

## 2011-10-16 NOTE — Progress Notes (Signed)
Patient: Scott Cruz DOB: [birth year] MRN: [id number] Date of service: 10/16/2011    [place] [facility]M  Brief patient profile:  66 y/o [place] M, current smoker, with PMH of HTN, HLD, and prostate cancer s/p treatment (pt thinks radiation seeds) admitted on 2/27 for scheduled posterior lumbar fusion of L3-L4, L4-5 diskectomy. 3/1 patient noted to have ronchi, fever and cxr concerning for PNA. PCCM consulted for PNA 3/2.   Subjective: No acute change. Sitting in chair reading  Objective: Vital signs in last 24 hours: Temp:  [97.7 F (36.5 C)-100.2 F (37.9 C)] 97.8 F (36.6 C) (03/05 0600) Pulse Rate:  [67-81] 76  (03/05 0600) Resp:  [18] 18  (03/05 0600) BP: (100-125)/(56-66) 116/66 mmHg (03/05 0600) SpO2:  [93 %-98 %] 95 % (03/05 0600) ra 3/5 no + culture data Physical Examination:  General: wdwn elderly male in NAD.  passive, supine Neuro: intact CV: s1s2 rrr, no m/r/g, no JVD  PULM: resp's even/non-labored on RA, diminished bases otherwise CTA GI: round / soft, bsx4 active  Extremities: Warm / dry, no edema. Back brace in palce  Lab Results:  [facility] 10/15/11 0535  WBC 6.9  HGB 9.3*  HCT 27.8*  PLT 265   BMET  Basename 10/15/11 0535  NA 141  K 3.0*  CL 105  CO2 29  GLUCOSE 115*  BUN 10  CREATININE 0.73  CALCIUM 8.4    Studies/Results: Dg Chest Port 1 View  10/15/2011  *RADIOLOGY REPORT*  Clinical Data: Pneumonia.  PORTABLE CHEST - 1 VIEW  Comparison: 10/14/2011  Findings: Mild cardiomegaly.  Vascular congestion.  Right base atelectasis.  No confluent opacity on the left.  No overt edema. No effusions.  IMPRESSION: Cardiomegaly with vascular congestion and right base atelectasis.  Original Report Authenticated By: Cyndie Chime, M.D.    Medications: reviewec  Assessment/Plan: 1) Mild pneumonia RLL- Levaquin started 3/1. Cx pending.  No distress.  On RA.  CXR improved.   Plan:  Change abx to PO - rec complete 10 days F/u cxr  Follow cultures Pulmonary  hygiene  2) Hypokalemia   Plan  Replaced per primary   3) Encephalopathy/ sedation- resolved 3/5 PLAN -  Monitor Minimize narcs/sedation  4) Difficulty voiding- hx XRT for prostate Ca  PLAN -  Trial off foley as tolerated  If cultures remains neg would recommend complete 10 days levaquin PO (stop date 3/10).  PCCM signing off please call back if needed.    Brett Canales Minor ACNP [facility] PCCM Pager [phone] till 3 pm If no answer page [phone] 10/16/2011, 10:22 AM

## 2011-10-16 NOTE — Progress Notes (Signed)
   CARE MANAGEMENT NOTE 10/16/2011  Patient:  Graeff,Rob   Account Number:  [id number]  Date Initiated:  10/11/2011  Documentation initiated by:  Johny Shock  Subjective/Objective Assessment:   Pt for PT/OT eval     Action/Plan:   Await evalations and recommendation   Anticipated DC Date:  10/13/2011   Anticipated DC Plan:  HOME W HOME HEALTH SERVICES      DC Planning Services  CM consult      [facility] Choice  HOME HEALTH   Choice offered to / List presented to:  C-1 Patient   DME arranged  Levan Hurst      DME agency  [facility]     HH arranged  HH-2 PT      [facility] agency  [facility]   Status of service:  Completed, signed off Medicare Important Message given?   (If response is "NO", the following Medicare IM given date fields will be blank) Date Medicare IM given:   Date Additional Medicare IM given:    Discharge Disposition:  HOME W HOME HEALTH SERVICES  Per UR Regulation:    Comments:  10/16/2011 1430 Spoke to pt and niece. Provide HH list for pt and made aware of agency that accepted his insurance. Contacted [facility] for HH PT and RW. Attempted call to son, Harriet Pho to follow up. Unable to reach and no message left. No voice messages on phone. Harvie [phone] or [phone].  Isidoro Donning RN CCM Case Mgmt phone [phone]

## 2011-10-16 NOTE — Progress Notes (Signed)
Physical Therapy Treatment Patient Details Name: Scott Cruz MRN: [id number] DOB: 1946/01/03 Today's Date: 10/16/2011  PT Assessment/Plan  PT - Assessment/Plan Comments on Treatment Session: Pt able to increase ambulation however needs cues to stand upright and maintain position.  Continue to recommend HHPT at d/c.   PT Plan: Discharge plan remains appropriate;Frequency remains appropriate PT Frequency: Min 5X/week Follow Up Recommendations: Home health PT Equipment Recommended: Rolling walker with 5" wheels PT Goals  Acute Rehab PT Goals PT Goal Formulation: With patient Time For Goal Achievement: 7 days Pt will Roll Supine to Right Side: with modified independence PT Goal: Rolling Supine to Right Side - Progress: Progressing toward goal Pt will go Supine/Side to Sit: with modified independence PT Goal: Supine/Side to Sit - Progress: Progressing toward goal Pt will go Sit to Supine/Side: with modified independence PT Goal: Sit to Supine/Side - Progress: Progressing toward goal Pt will go Sit to Stand: with modified independence PT Goal: Sit to Stand - Progress: Progressing toward goal Pt will go Stand to Sit: with modified independence PT Goal: Stand to Sit - Progress: Progressing toward goal Pt will Ambulate: >150 feet;with modified independence;with rolling walker PT Goal: Ambulate - Progress: Progressing toward goal  PT Treatment Precautions/Restrictions  Precautions Precautions: Back Precaution Booklet Issued: Yes (comment) Precaution Comments: Reviewed back precautions and handout. Required Braces or Orthoses: Yes Spinal Brace: Lumbar corset;Applied in sitting position Restrictions Weight Bearing Restrictions: No Mobility (including Balance) Bed Mobility Bed Mobility: Yes Rolling Right: 5: Supervision;With rail Rolling Right Details (indicate cue type and reason): VCs for technique with proper hand placement and body position to prevent twisting. Right Sidelying to Sit:  4: Min assist;HOB flat;With rails Right Sidelying to Sit Details (indicate cue type and reason): (A) to elevate trunk OOB with cues for proper technique. Sitting - Scoot to Edge of Bed: 5: Supervision Transfers Transfers: Yes Sit to Stand: 4: Min assist;With upper extremity assist;From bed Sit to Stand Details (indicate cue type and reason): (A) to initiate transfer with cues for proper technique. Stand to Sit: 4: Min assist;With upper extremity assist;With armrests;To chair/3-in-1 Stand to Sit Details: (A) to slowly descend with cues for proper hand placement.  Pt tends to keep hands on RW with transfer. Ambulation/Gait Ambulation/Gait: Yes Ambulation/Gait Assistance: 4: Min assist Ambulation/Gait Assistance Details (indicate cue type and reason): (A) to manage RW with max cues for body position within RW and to stand upright for forward head posture. Ambulation Distance (Feet): 200 Feet Assistive device: Rolling walker Gait Pattern: Step-through pattern;Decreased stride length;Trunk flexed Stairs: No Wheelchair Mobility Wheelchair Mobility: No  Posture/Postural Control Posture/Postural Control: No significant limitations Balance Balance Assessed: Yes Static Sitting Balance Static Sitting - Balance Support: Feet supported Static Sitting - Level of Assistance: 5: Stand by assistance Exercise    End of Session PT - End of Session Equipment Utilized During Treatment: Gait belt;Back brace Activity Tolerance: Patient tolerated treatment well Patient left: in chair;with call bell in reach;with family/visitor present Nurse Communication: Mobility status for transfers;Mobility status for ambulation General Behavior During Session: [facility] for tasks performed Cognition: [facility] for tasks performed  Loui Massenburg 10/16/2011, 9:36 AM Pager:  [phone]

## 2011-10-17 ENCOUNTER — Ambulatory Visit: Payer: Medicare Other | Admitting: Neurology

## 2011-10-17 MED ORDER — HYDROCHLOROTHIAZIDE 25 MG PO TABS
25.0000 mg | ORAL_TABLET | Freq: Every day | ORAL | Status: DC
  Filled 2011-10-17: qty 1

## 2011-10-17 NOTE — Discharge Summary (Signed)
Physician Discharge Summary  Patient ID: Scott Cruz MRN: [id number] DOB/AGE: May 30, 1946 66 y.o.  Admit date: 10/10/2011 Discharge date: 10/17/2011  Admission Diagnoses:lumbar stenosis plus spondylolisthesis  Discharge Diagnoses: same plus pneumonia Active Problems:  * No active hospital problems. *    Discharged Condition:stable  Hospital Course: lumbar decompression plus fusion  Consults: pneumologist  Significant Diagnostic Studies:mri lumbar , chest xray  Treatments: surgery plus antibiotics  Discharge Exam: Blood pressure 96/58, pulse 91, temperature 98.4 F (36.9 C), temperature source Oral, resp. rate 18, height 5\' 4"  (1.626 m), weight 70.8 kg (156 lb 1.4 oz), SpO2 95.00%. Ambulating. Afebril. No sob  Disposition: Final discharge disposition not confirmed   Medication List  As of 10/17/2011  9:38 AM   ASK your doctor about these medications         hydrochlorothiazide 25 MG tablet   Commonly known as: HYDRODIURIL   Take 25 mg by mouth daily.      simvastatin 20 MG tablet   Commonly known as: ZOCOR   Take 20 mg by mouth every evening.      Tamsulosin HCl 0.4 MG Caps   Commonly known as: FLOMAX   Take 0.4 mg by mouth daily.           Follow-up Information    Follow up with [facility] . (Home Health Physical Therapy)    Contact information:   [phone]       to be seen by me in 3 weeks or before. Also to call pneumologist(dr wert). Signed: Karn Cassis 10/17/2011, 9:38 AM

## 2011-10-17 NOTE — Progress Notes (Signed)
Physical Therapy Treatment Patient Details Name: Scott Cruz MRN: [id number] DOB: [birth year] Today's Date: 10/17/2011  PT Assessment/Plan  PT - Assessment/Plan Comments on Treatment Session: Pt doing well with mobility. Today pt reporting he has 7 steps to enter home although on eval pt reporting he had none. Pt performed stairs well. Pt does not want a shower chair, advised to take "bird baths" initially for safety. Pt verbalized understanding. Pt has no further PT related questions PT Plan: Discharge plan remains appropriate;Frequency remains appropriate PT Frequency: Min 5X/week Follow Up Recommendations: Home health PT Equipment Recommended: Rolling walker with 5" wheels *PT in room from 0950 to 1007, while treatment underway PT order was discontinued. PT Goals  Acute Rehab PT Goals Pt will Roll Supine to Right Side: with modified independence PT Goal: Rolling Supine to Right Side - Progress: Met Pt will go Supine/Side to Sit: with modified independence PT Goal: Supine/Side to Sit - Progress: Progressing toward goal Pt will go Sit to Stand: with modified independence PT Goal: Sit to Stand - Progress: Progressing toward goal Pt will go Stand to Sit: with modified independence PT Goal: Stand to Sit - Progress: Progressing toward goal Pt will Ambulate: >150 feet;with modified independence;with rolling walker PT Goal: Ambulate - Progress: Progressing toward goal  PT Treatment Precautions/Restrictions  Precautions Precautions: Back Precaution Booklet Issued: Yes (comment) Precaution Comments: Pt able to recall 2/3 back precautions with increased time. Pt required verbal cues to remember "no arching" Pt required cues during functional mobility to prevent twisting (with stairs). Required Braces or Orthoses: Yes Spinal Brace: Lumbar corset;Applied in sitting position Restrictions Weight Bearing Restrictions: No Mobility (including Balance) Bed Mobility Bed Mobility: Yes Rolling Right:  With rail;6: Modified independent (Device/Increase time) Rolling Right Details (indicate cue type and reason): Pt demonstrating log roll Right Sidelying to Sit: HOB flat;With rails;5: Supervision Right Sidelying to Sit Details (indicate cue type and reason): Verbal cues for back precautions, no physical assist needed today. Sitting - Scoot to Edge of Bed: 5: Supervision Sitting - Scoot to [place] of Bed Details (indicate cue type and reason): Verbal cues to initiate Transfers Transfers: Yes Sit to Stand: With upper extremity assist;From bed;Other (comment) (min-guard assist) Sit to Stand Details (indicate cue type and reason): Verbal cues to remind pt to not pull up on RW. Min-guard for safety Stand to Sit: With upper extremity assist;With armrests;To chair/3-in-1;5: Supervision Stand to Sit Details: Verbal cues for UE placement and to adhere to back precautions.  Ambulation/Gait Ambulation/Gait: Yes Ambulation/Gait Assistance: Other (comment) (min-guard assist) Ambulation/Gait Assistance Details (indicate cue type and reason): Verbla cues for upright posture (repeated). Verbal cues for negotiation of RW.  Ambulation Distance (Feet): 200 Feet Assistive device: Rolling walker Gait Pattern: Step-through pattern;Decreased stride length;Trunk flexed Stairs: Yes Stairs Assistance: 4: Min assist;Other (comment) (min-guard assist) Stairs Assistance Details (indicate cue type and reason): Min assist for first step, the rest pt performed at min-guard assist. Verbal cues for safety, use of rail, and sequencing of LEs. Stair Management Technique: One rail Right;Alternating pattern;Step to pattern;Forwards Number of Stairs: 10  Wheelchair Mobility Wheelchair Mobility: No    End of Session PT - End of Session Equipment Utilized During Treatment: Gait belt;Back brace Activity Tolerance: Patient tolerated treatment well Patient left: in chair;with call bell in reach;with family/visitor  present General Behavior During Session: [facility] for tasks performed Cognition: Impaired Cognitive Impairment: Decreased memory, decreased recall of precautions.   Sherrine Maples Cheek 10/17/2011, 10:22 AM  Dahlia Client ([place]verely Pace) Carleene Mains PT, DPT Acute Rehabilitation (  [phone]    

## 2011-10-18 LAB — CULTURE, BLOOD (ROUTINE X 2)
Culture  Setup Time: 201303012255
Culture: NO GROWTH

## 2011-11-22 ENCOUNTER — Other Ambulatory Visit: Payer: Self-pay | Admitting: Family Medicine

## 2011-11-22 DIAGNOSIS — E041 Nontoxic single thyroid nodule: Secondary | ICD-10-CM

## 2011-11-27 ENCOUNTER — Ambulatory Visit
Admission: RE | Admit: 2011-11-27 | Discharge: 2011-11-27 | Disposition: A | Discharge status: Home / Self Care | Payer: PRIVATE HEALTH INSURANCE | Source: Ambulatory Visit | Attending: Family Medicine | Admitting: Family Medicine

## 2011-11-27 DIAGNOSIS — E041 Nontoxic single thyroid nodule: Secondary | ICD-10-CM

## 2011-12-03 ENCOUNTER — Other Ambulatory Visit: Payer: Self-pay | Admitting: Family Medicine

## 2011-12-03 DIAGNOSIS — E041 Nontoxic single thyroid nodule: Secondary | ICD-10-CM

## 2011-12-11 ENCOUNTER — Other Ambulatory Visit (HOSPITAL_COMMUNITY)
Admission: RE | Admit: 2011-12-11 | Discharge: 2011-12-11 | Disposition: A | Discharge status: Home / Self Care | Payer: PRIVATE HEALTH INSURANCE | Source: Ambulatory Visit | Attending: Diagnostic Radiology | Admitting: Diagnostic Radiology

## 2011-12-11 ENCOUNTER — Ambulatory Visit
Admission: RE | Admit: 2011-12-11 | Discharge: 2011-12-11 | Disposition: A | Discharge status: Home / Self Care | Payer: PRIVATE HEALTH INSURANCE | Source: Ambulatory Visit | Attending: Family Medicine | Admitting: Family Medicine

## 2011-12-11 DIAGNOSIS — E041 Nontoxic single thyroid nodule: Secondary | ICD-10-CM

## 2011-12-11 DIAGNOSIS — E049 Nontoxic goiter, unspecified: Secondary | ICD-10-CM | POA: Insufficient documentation

## 2013-08-20 IMAGING — CR DG CHEST 2V
2 series · 2 of 2 positions shown · non-contrast
Comparison: Report 11/01/1999 no images available

CLINICAL DATA: Preop

CHEST - 2 VIEW

[view not recorded (1 of 2)]
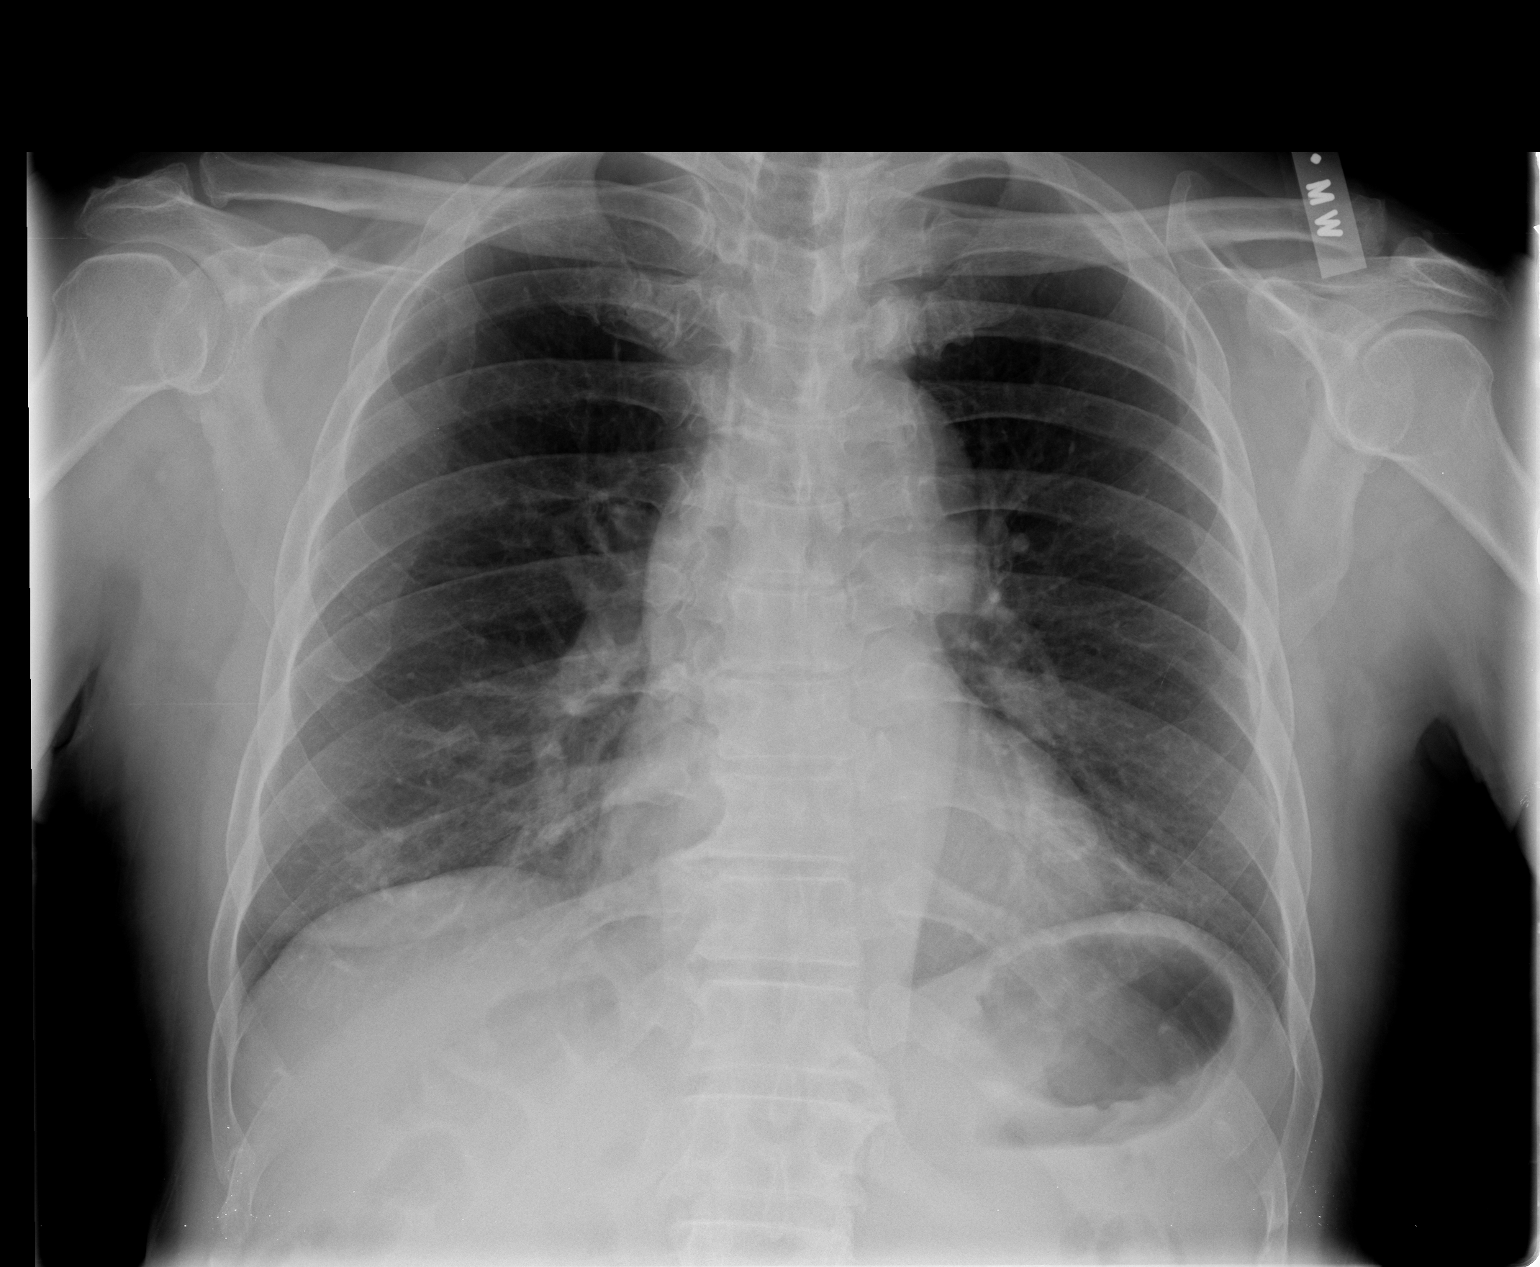

[view not recorded (2 of 2)]
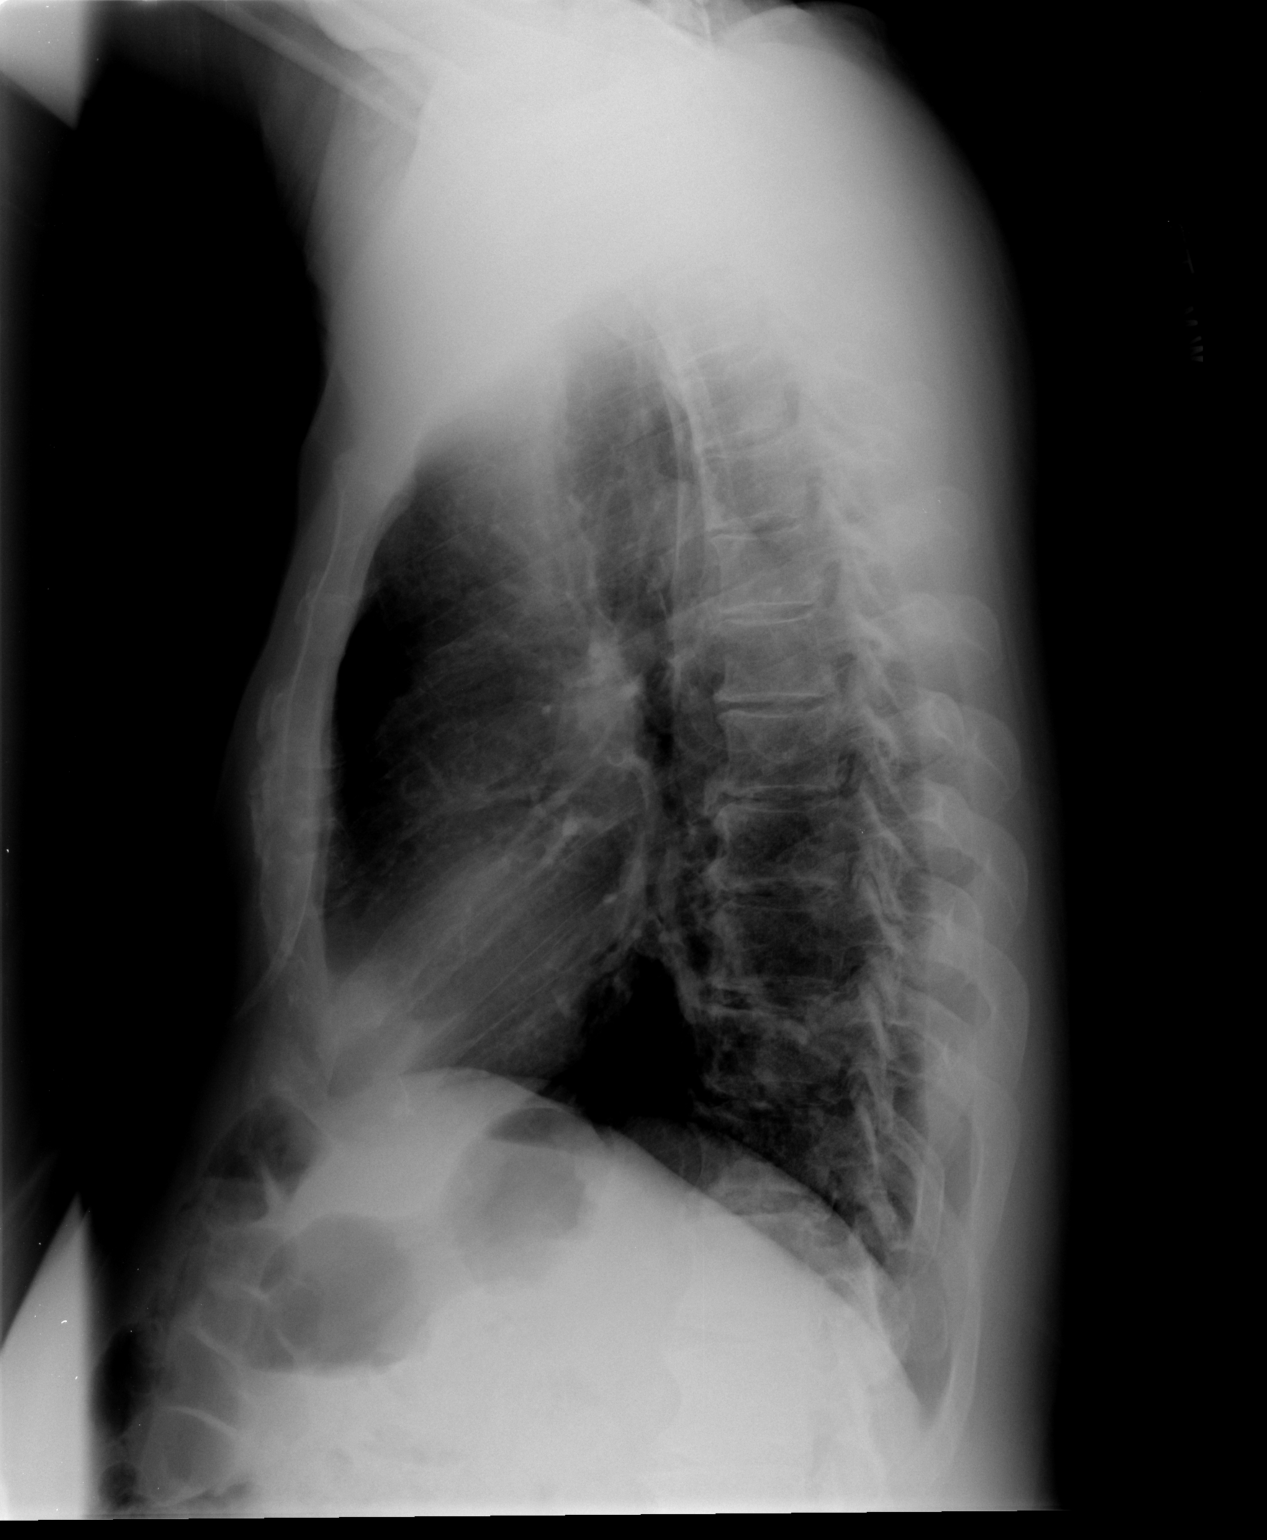

[2 of 2 positions shown; findings below may reference images not displayed]

FINDINGS: Cardiomediastinal silhouette is unremarkable.  No acute
infiltrate or pleural effusion.  No pulmonary edema.  Mild
degenerative changes thoracic spine.
IMPRESSION: No active disease.  Mild degenerative changes thoracic spine.

## 2014-03-02 ENCOUNTER — Other Ambulatory Visit: Payer: Self-pay | Admitting: Neurosurgery

## 2014-03-02 DIAGNOSIS — M5136 Other intervertebral disc degeneration, lumbar region: Secondary | ICD-10-CM

## 2016-08-24 ENCOUNTER — Ambulatory Visit (INDEPENDENT_AMBULATORY_CARE_PROVIDER_SITE_OTHER): Payer: Medicare Other | Admitting: Neurology

## 2016-08-24 ENCOUNTER — Encounter: Payer: Self-pay | Admitting: Neurology

## 2016-08-24 VITALS — BP 122/70 | HR 85 | Ht 64.0 in | Wt 148.7 lb

## 2016-08-24 DIAGNOSIS — R200 Anesthesia of skin: Secondary | ICD-10-CM

## 2016-08-24 DIAGNOSIS — M5416 Radiculopathy, lumbar region: Secondary | ICD-10-CM

## 2016-08-24 DIAGNOSIS — R202 Paresthesia of skin: Secondary | ICD-10-CM | POA: Diagnosis not present

## 2016-08-24 MED ORDER — GABAPENTIN 600 MG PO TABS: ORAL_TABLET | ORAL | 0 refills | Status: DC

## 2016-08-24 NOTE — Patient Instructions (Signed)
1.  Take gabapentin 600mg  tablets.  Take 1/2 tablet three times daily for 7 days, then 1 tablet three times daily (morning, noon, night) 2.  Check NCV-EMG of lower extremities. 3.  Wear wrist splints at night to see if it helps numbness in hands.  4.  Follow up in 4 months.

## 2016-08-24 NOTE — Progress Notes (Signed)
NEUROLOGY CONSULTATION NOTE  Scott Cruz MRN: [id number] DOB: Dec 16, 1945  Referring provider: Dr. Marisue Humble Primary care provider: Dr. Marisue Humble  Reason for consult:  neuropathy  HISTORY OF PRESENT ILLNESS: Scott Cruz is a 71 year old male with hypertension, hypercholesterolemia, thyroid nodule and history of prostate cancer who presents for peripheral neuropathy.  History supplemented by PCP note.  He has a history of left L3 and L4 radiculopathy secondary to lumbar stenosis at L3-4.  He has been successfully treated for pain with selective nerve root blocks at L3 and L4 on the left.  He previously underwent lumbar decompression and fusion at L3-L4 on left and at right L4-L5 (last surgery in 2013).  Despite nerve root blocks and surgery, he still has pain radiating down both legs.  He also reports numbness and tingling in the feet.  He also reports numbness and tingling in the hands.  He notices it when he wakes up in bed and needs to shake it out.  This has been occurring for about a year.  He denies neck pain but notes pain in the shoulders.  Labs from 06/04/16:  CMP with Na 141, K 4.2, glucose 80, BUN 11, Cr 0.96, total bili 0.7, ALP 44, AST 21, and ALT 19; Hgb A1c 5.6, estimated average glucose 114 Labs from 11/28/15:  TSH 1.04 Labs from 11/26/14:  B12 over 1500  PAST MEDICAL HISTORY: Past Medical History:  Diagnosis Date  . Hyperlipidemia   . Hypertension   . Prostate CA [facility])     PAST SURGICAL HISTORY: Past Surgical History:  Procedure Laterality Date  . NO PAST SURGERIES      MEDICATIONS: Current Outpatient Prescriptions on File Prior to Visit  Medication Sig Dispense Refill  . hydrochlorothiazide (HYDRODIURIL) 25 MG tablet Take 25 mg by mouth daily.    . simvastatin (ZOCOR) 20 MG tablet Take 20 mg by mouth every evening.    . Tamsulosin HCl (FLOMAX) 0.4 MG CAPS Take 0.4 mg by mouth daily.     No current facility-administered medications on file prior to visit.      ALLERGIES: No Known Allergies  FAMILY HISTORY: No family history on file.  SOCIAL HISTORY: Social History   Social History  . Marital status: Married    Spouse name: N/A  . Number of children: N/A  . Years of education: N/A   Occupational History  . Not on file.   Social History Main Topics  . Smoking status: Current Every Day Smoker    Packs/day: 1.00    Years: 50.00    Types: Cigarettes  . Smokeless tobacco: Never Used  . Alcohol use No  . Drug use: No  . Sexual activity: Not on file   Other Topics Concern  . Not on file   Social History Narrative  . No narrative on file    REVIEW OF SYSTEMS: Constitutional: No fevers, chills, or sweats, no generalized fatigue, change in appetite Eyes: No visual changes, double vision, eye pain Ear, nose and throat: No hearing loss, ear pain, nasal congestion, sore throat Cardiovascular: No chest pain, palpitations Respiratory:  No shortness of breath at rest or with exertion, wheezes GastrointestinaI: No nausea, vomiting, diarrhea, abdominal pain, fecal incontinence Genitourinary:  No dysuria, urinary retention or frequency Musculoskeletal:  Back pain Integumentary: No rash, pruritus, skin lesions Neurological: as above Psychiatric: No depression, insomnia, anxiety Endocrine: No palpitations, fatigue, diaphoresis, mood swings, change in appetite, change in weight, increased thirst Hematologic/Lymphatic:  No purpura, petechiae. Allergic/Immunologic: no itchy/runny  eyes, nasal congestion, recent allergic reactions, rashes  PHYSICAL EXAM: Vitals:   08/24/16 1029  BP: 122/70  Pulse: 85   General: No acute distress.  Patient appears well-groomed.  Head:  Normocephalic/atraumatic Eyes:  fundi examined but not visualized Neck: supple, no paraspinal tenderness, full range of motion Back: bilateral paraspinal tenderness Heart: regular rate and rhythm Lungs: Clear to auscultation bilaterally. Vascular: No carotid  bruits. Neurological Exam: Mental status: alert and oriented to person, place, and time, recent and remote memory intact, fund of knowledge intact, attention and concentration intact, speech fluent and not dysarthric, language intact. Cranial nerves: CN I: not tested CN II: pupils equal, round and reactive to light, visual fields intact CN III, IV, VI:  full range of motion, no nystagmus, no ptosis CN V: facial sensation intact CN VII: upper and lower face symmetric CN VIII: hearing intact CN IX, X: gag intact, uvula midline CN XI: sternocleidomastoid and trapezius muscles intact CN XII: tongue midline Bulk & Tone: normal, no fasciculations. Motor:  5/5 throughout  Sensation:  Pinprick and vibration sensation intact.  Positive Tinel's sign bilaterally Deep Tendon Reflexes:  2+ throughout, toes downgoing.  Finger to nose testing:  Without dysmetria.  Heel to shin:  Without dysmetria.  Gait:  Wide-based antalgic.  Able to turn and tandem walk. Romberg negative.  IMPRESSION: 1.  Chronic radicular pain due to lumbar stenosis.  2.  Probable bilateral carpal tunnel syndrome  PLAN: 1.  Will titrate gabapentin up to 600mg  three times daily 2.  Will check NCV-EMG of lower extremities to assess for underlying polyneuropathy as well. 3.  Advised that he wear wrist splints at night for possible carpal tunnel.  If ineffective, will get NCV of upper extremities. 4.  Follow up in  4 months.  Thank you for allowing me to take part in the care of this patient.  Metta Clines, DO  CC:  Gaynelle Arabian, MD

## 2017-01-22 ENCOUNTER — Ambulatory Visit (INDEPENDENT_AMBULATORY_CARE_PROVIDER_SITE_OTHER): Payer: Medicare Other | Admitting: Neurology

## 2017-01-22 ENCOUNTER — Encounter: Payer: Self-pay | Admitting: Neurology

## 2017-01-22 VITALS — BP 100/58 | HR 62 | Ht 64.0 in | Wt 145.0 lb

## 2017-01-22 DIAGNOSIS — M5416 Radiculopathy, lumbar region: Secondary | ICD-10-CM | POA: Diagnosis not present

## 2017-01-22 DIAGNOSIS — G5603 Carpal tunnel syndrome, bilateral upper limbs: Secondary | ICD-10-CM | POA: Diagnosis not present

## 2017-01-22 DIAGNOSIS — M4802 Spinal stenosis, cervical region: Secondary | ICD-10-CM

## 2017-01-22 DIAGNOSIS — Z720 Tobacco use: Secondary | ICD-10-CM | POA: Diagnosis not present

## 2017-01-22 MED ORDER — GABAPENTIN 600 MG PO TABS: ORAL_TABLET | ORAL | 0 refills | Status: DC

## 2017-01-22 NOTE — Patient Instructions (Addendum)
1.  I will restart the gabapentin to help with pain in the legs.  Take 1/2 tablet three times daily for 7 days, then increase to 1 tablet three times daily.  Ask your PCP about further refills. 2.  You have discs in the neck pressing on the spinal cord.  I think you should be seen by a surgeon for evaluation because it can potentially get worse causing paralysis and weakness 3.  Continue wearing wrist splints.  If the numbness in hands get worse, consider seeing a surgeon to treat the carpal tunnel syndrome. 4.  Follow up as needed.
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
## 2017-12-21 ENCOUNTER — Emergency Department (HOSPITAL_COMMUNITY): Payer: Medicare Other

## 2017-12-21 ENCOUNTER — Encounter (HOSPITAL_COMMUNITY): Payer: Self-pay

## 2017-12-21 ENCOUNTER — Other Ambulatory Visit: Payer: Self-pay

## 2017-12-21 ENCOUNTER — Inpatient Hospital Stay (HOSPITAL_COMMUNITY)
Admission: EM | Admit: 2017-12-21 | Discharge: 2017-12-24 | DRG: 982 | Disposition: A | Discharge status: Home Health | Payer: Medicare Other | Attending: Internal Medicine | Admitting: Internal Medicine

## 2017-12-21 DIAGNOSIS — E876 Hypokalemia: Secondary | ICD-10-CM | POA: Diagnosis present

## 2017-12-21 DIAGNOSIS — I889 Nonspecific lymphadenitis, unspecified: Secondary | ICD-10-CM | POA: Diagnosis not present

## 2017-12-21 DIAGNOSIS — M65842 Other synovitis and tenosynovitis, left hand: Secondary | ICD-10-CM | POA: Diagnosis present

## 2017-12-21 DIAGNOSIS — I10 Essential (primary) hypertension: Secondary | ICD-10-CM | POA: Diagnosis present

## 2017-12-21 DIAGNOSIS — B9562 Methicillin resistant Staphylococcus aureus infection as the cause of diseases classified elsewhere: Secondary | ICD-10-CM | POA: Diagnosis present

## 2017-12-21 DIAGNOSIS — Z8546 Personal history of malignant neoplasm of prostate: Secondary | ICD-10-CM

## 2017-12-21 DIAGNOSIS — Z7982 Long term (current) use of aspirin: Secondary | ICD-10-CM

## 2017-12-21 DIAGNOSIS — L03012 Cellulitis of left finger: Secondary | ICD-10-CM | POA: Diagnosis not present

## 2017-12-21 DIAGNOSIS — F17210 Nicotine dependence, cigarettes, uncomplicated: Secondary | ICD-10-CM | POA: Diagnosis present

## 2017-12-21 DIAGNOSIS — L03114 Cellulitis of left upper limb: Secondary | ICD-10-CM | POA: Diagnosis present

## 2017-12-21 DIAGNOSIS — Z79899 Other long term (current) drug therapy: Secondary | ICD-10-CM

## 2017-12-21 DIAGNOSIS — E785 Hyperlipidemia, unspecified: Secondary | ICD-10-CM | POA: Diagnosis present

## 2017-12-21 LAB — COMPREHENSIVE METABOLIC PANEL
ALK PHOS: 42 U/L (ref 38–126)
ALT: 26 U/L (ref 17–63)
AST: 31 U/L (ref 15–41)
Albumin: 4.3 g/dL (ref 3.5–5.0)
Anion gap: 14 (ref 5–15)
BILIRUBIN TOTAL: 1.1 mg/dL (ref 0.3–1.2)
BUN: 11 mg/dL (ref 6–20)
CALCIUM: 9 mg/dL (ref 8.9–10.3)
CO2: 29 mmol/L (ref 22–32)
CREATININE: 0.95 mg/dL (ref 0.61–1.24)
Chloride: 93 mmol/L — ABNORMAL LOW (ref 101–111)
GFR calc non Af Amer: >60 mL/min (ref >60)
Glucose, Bld: 163 mg/dL — ABNORMAL HIGH (ref 65–99)
Potassium: 2.8 mmol/L — ABNORMAL LOW (ref 3.5–5.1)
SODIUM: 136 mmol/L (ref 135–145)
TOTAL PROTEIN: 7.5 g/dL (ref 6.5–8.1)

## 2017-12-21 LAB — CBC WITH DIFFERENTIAL/PLATELET
BASOS ABS: 0 10*3/uL (ref 0.0–0.1)
Basophils Relative: 0 %
EOS ABS: 0.1 10*3/uL (ref 0.0–0.7)
Eosinophils Relative: 1 %
HCT: 42.1 % (ref 39.0–52.0)
Hemoglobin: 14.6 g/dL (ref 13.0–17.0)
Lymphocytes Relative: 18 %
Lymphs Abs: 2.5 10*3/uL (ref 0.7–4.0)
MCH: 21.5 pg — ABNORMAL LOW (ref 26.0–34.0)
MCHC: 34.7 g/dL (ref 30.0–36.0)
MCV: 61.9 fL — ABNORMAL LOW (ref 78.0–100.0)
MONO ABS: 1 10*3/uL (ref 0.1–1.0)
Monocytes Relative: 7 %
NEUTROS ABS: 10.1 10*3/uL — AB (ref 1.7–7.7)
NEUTROS PCT: 74 %
PLATELETS: 215 10*3/uL (ref 150–400)
RBC: 6.8 MIL/uL — ABNORMAL HIGH (ref 4.22–5.81)
RDW: 16.7 % — ABNORMAL HIGH (ref 11.5–15.5)
WBC: 13.7 10*3/uL — ABNORMAL HIGH (ref 4.0–10.5)

## 2017-12-21 LAB — I-STAT CG4 LACTIC ACID, ED
Lactic Acid, Venous: 2.47 mmol/L (ref 0.5–1.9)
Lactic Acid, Venous: 1.52 mmol/L (ref 0.5–1.9)

## 2017-12-21 MED ORDER — SODIUM CHLORIDE 0.9 % IV BOLUS
1000.0000 mL | Freq: Once | INTRAVENOUS | Status: AC
Start: 2017-12-21 — End: 2017-12-22
  Administered 2017-12-21: 1000 mL via INTRAVENOUS

## 2017-12-21 MED ORDER — POTASSIUM CHLORIDE CRYS ER 20 MEQ PO TBCR
40.0000 meq | EXTENDED_RELEASE_TABLET | Freq: Once | ORAL | Status: AC
  Administered 2017-12-22: 40 meq via ORAL
  Filled 2017-12-21: qty 2

## 2017-12-21 MED ORDER — SODIUM CHLORIDE 0.9 % IV BOLUS
500.0000 mL | Freq: Once | INTRAVENOUS | Status: AC
  Administered 2017-12-21: 500 mL via INTRAVENOUS

## 2017-12-21 MED ORDER — CEFAZOLIN SODIUM-DEXTROSE 1-4 GM/50ML-% IV SOLN
1.0000 g | Freq: Once | INTRAVENOUS | Status: AC
  Administered 2017-12-21: 1 g via INTRAVENOUS
  Filled 2017-12-21: qty 50

## 2017-12-21 NOTE — ED Provider Notes (Signed)
[facility]PART[facility] Provider Note   CSN: [id number] Arrival date & time: 12/21/17  2018  Time seen 23:04 PM    History   Chief Complaint Chief Complaint  Patient presents with  . Cellulitis    HPI Scott Cruz is a 72 y.o. male.  HPI  HPI patient states about noon yesterday he had itching on his left middle finger, he states he did not scratch it.  He also denies any possible injury.  He denies doing any yard work, and states although he has a cat it did not injure him.  He states last night it started getting swollen and then started getting painful.  He is unaware of fever, nausea, or vomiting.  He states he is never had this happen before.  Patient is right-handed.  He states he thinks his last tetanus was less than 10 years ago.  Gaynelle Arabian, MD    Past Medical History:  Diagnosis Date  . Hyperlipidemia   . Hypertension   . Prostate CA [facility])     Patient Active Problem List   Diagnosis Date Noted  . Cellulitis of left middle finger 12/22/2017    Past Surgical History:  Procedure Laterality Date  . NO PAST SURGERIES          Home Medications    Prior to Admission medications   Medication Sig Start Date End Date Taking? Authorizing Provider  aspirin EC 81 MG tablet Take 81 mg by mouth daily.   Yes [provider]  hydrochlorothiazide (HYDRODIURIL) 25 MG tablet Take 25 mg by mouth daily.   Yes [provider]  simvastatin (ZOCOR) 20 MG tablet Take 20 mg by mouth every evening.   Yes [provider]  vitamin B-12 (CYANOCOBALAMIN) 1000 MCG tablet Take 1,000 mcg by mouth daily.   Yes [provider]    Family History History reviewed. No pertinent family history.  Social History Social History   Tobacco Use  . Smoking status: Current Every Day Smoker    Packs/day: 1.00    Years: 50.00    Pack years: 50.00    Types: Cigarettes  . Smokeless tobacco: Never Used  Substance Use Topics  .  Alcohol use: No  . Drug use: No  lives at home Smokes States wife is in the hospital after having a recent stroke   Allergies   Patient has no known allergies.   Review of Systems Review of Systems  All other systems reviewed and are negative.    Physical Exam Updated Vital Signs BP 132/75   Pulse 72   Temp 97.9 F (36.6 C) (Oral)   Resp 16   Ht 5\' 4"  (1.626 m)   Wt 65.8 kg (145 lb)   SpO2 95%   BMI 24.89 kg/m   Vital signs normal    Physical Exam  Constitutional: He is oriented to person, place, and time. He appears well-developed and well-nourished. No distress.  HENT:  Head: Normocephalic and atraumatic.  Right Ear: External ear normal.  Left Ear: External ear normal.  Nose: Nose normal.  Eyes: Conjunctivae and EOM are normal.  Neck: Normal range of motion.  Cardiovascular: Normal rate.  Pulmonary/Chest: Effort normal. No respiratory distress.  Musculoskeletal: He exhibits edema and tenderness. He exhibits no deformity.  Neurological: He is alert and oriented to person, place, and time. No cranial nerve deficit.  Skin: Skin is warm and dry. He is not diaphoretic. There is erythema.  Patient has diffuse swelling of  his left middle finger with erythema that involves the finger, the dorsum of his left hand, and he has red streaking that goes just proximal to his elbow.  He has tenderness in the left axilla without obvious lymphadenopathy.  On the dorsum of his left middle finger over the middle phalanx he has a large bullous lesion with purplish discoloration with some purulent material settled around the edge.  The nail is normal.  Psychiatric: He has a normal mood and affect. His behavior is normal. Thought content normal.              ED Treatments / Results  Labs (all labs ordered are listed, but only abnormal results are displayed) Results for orders placed or performed during the hospital encounter of 12/21/17  Comprehensive metabolic panel    Result Value Ref Range   Sodium 136 135 - 145 mmol/L   Potassium 2.8 (L) 3.5 - 5.1 mmol/L   Chloride 93 (L) 101 - 111 mmol/L   CO2 29 22 - 32 mmol/L   Glucose, Bld 163 (H) 65 - 99 mg/dL   BUN 11 6 - 20 mg/dL   Creatinine, Ser 0.95 0.61 - 1.24 mg/dL   Calcium 9.0 8.9 - 10.3 mg/dL   Total Protein 7.5 6.5 - 8.1 g/dL   Albumin 4.3 3.5 - 5.0 g/dL   AST 31 15 - 41 U/L   ALT 26 17 - 63 U/L   Alkaline Phosphatase 42 38 - 126 U/L   Total Bilirubin 1.1 0.3 - 1.2 mg/dL   GFR calc non Af Amer >60 >60 mL/min   GFR calc Af Amer >60 >60 mL/min   Anion gap 14 5 - 15  CBC with Differential  Result Value Ref Range   WBC 13.7 (H) 4.0 - 10.5 K/uL   RBC 6.80 (H) 4.22 - 5.81 MIL/uL   Hemoglobin 14.6 13.0 - 17.0 g/dL   HCT 42.1 39.0 - 52.0 %   MCV 61.9 (L) 78.0 - 100.0 fL   MCH 21.5 (L) 26.0 - 34.0 pg   MCHC 34.7 30.0 - 36.0 g/dL   RDW 16.7 (H) 11.5 - 15.5 %   Platelets 215 150 - 400 K/uL   Neutrophils Relative % 74 %   Lymphocytes Relative 18 %   Monocytes Relative 7 %   Eosinophils Relative 1 %   Basophils Relative 0 %   Neutro Abs 10.1 (H) 1.7 - 7.7 K/uL   Lymphs Abs 2.5 0.7 - 4.0 K/uL   Monocytes Absolute 1.0 0.1 - 1.0 K/uL   Eosinophils Absolute 0.1 0.0 - 0.7 K/uL   Basophils Absolute 0.0 0.0 - 0.1 K/uL   RBC Morphology TARGET CELLS   Sedimentation rate  Result Value Ref Range   Sed Rate 9 0 - 16 mm/hr  C-reactive protein  Result Value Ref Range   CRP 2.6 (H) <1.0 mg/dL  CBC  Result Value Ref Range   WBC 13.1 (H) 4.0 - 10.5 K/uL   RBC 6.29 (H) 4.22 - 5.81 MIL/uL   Hemoglobin 13.0 13.0 - 17.0 g/dL   HCT 38.8 (L) 39.0 - 52.0 %   MCV 61.7 (L) 78.0 - 100.0 fL   MCH 20.7 (L) 26.0 - 34.0 pg   MCHC 33.5 30.0 - 36.0 g/dL   RDW 16.4 (H) 11.5 - 15.5 %   Platelets 222 150 - 400 K/uL  Magnesium  Result Value Ref Range   Magnesium 1.7 1.7 - 2.4 mg/dL  Phosphorus  Result Value Ref Range  Phosphorus 2.8 2.5 - 4.6 mg/dL  Basic metabolic panel  Result Value Ref Range   Sodium 140 135  - 145 mmol/L   Potassium 3.2 (L) 3.5 - 5.1 mmol/L   Chloride 102 101 - 111 mmol/L   CO2 27 22 - 32 mmol/L   Glucose, Bld 117 (H) 65 - 99 mg/dL   BUN 7 6 - 20 mg/dL   Creatinine, Ser 0.86 0.61 - 1.24 mg/dL   Calcium 8.2 (L) 8.9 - 10.3 mg/dL   GFR calc non Af Amer >60 >60 mL/min   GFR calc Af Amer >60 >60 mL/min   Anion gap 11 5 - 15  CBC  Result Value Ref Range   WBC 12.9 (H) 4.0 - 10.5 K/uL   RBC 6.27 (H) 4.22 - 5.81 MIL/uL   Hemoglobin 13.0 13.0 - 17.0 g/dL   HCT 38.7 (L) 39.0 - 52.0 %   MCV 61.7 (L) 78.0 - 100.0 fL   MCH 20.7 (L) 26.0 - 34.0 pg   MCHC 33.6 30.0 - 36.0 g/dL   RDW 16.6 (H) 11.5 - 15.5 %   Platelets 230 150 - 400 K/uL  I-Stat CG4 Lactic Acid, ED  Result Value Ref Range   Lactic Acid, Venous 2.47 (HH) 0.5 - 1.9 mmol/L   Comment NOTIFIED PHYSICIAN   I-Stat CG4 Lactic Acid, ED  Result Value Ref Range   Lactic Acid, Venous 1.52 0.5 - 1.9 mmol/L   Laboratory interpretation all normal except for a leukocytosis, initial elevated lactic acid, hypokalemia, elevated CRP, but normal SED Rate    EKG None  Radiology Dg Finger Middle Left  Result Date: 12/21/2017 CLINICAL DATA:  Blistering of the left middle finger. EXAM: LEFT MIDDLE FINGER 2+V COMPARISON:  None. FINDINGS: No acute fracture nor bone destruction. Joint spaces are maintained. Soft tissue swelling over the dorsal and ulnar aspect of the left middle finger is identified at the level of the middle phalanx. IMPRESSION: Soft tissue swelling of the left middle finger without underlying bone destruction or fracture. Electronically Signed   By: Ashley Royalty M.D.   On: 12/21/2017 23:54    Procedures Procedures (including critical care time)  Medications Ordered in ED Medications  sodium chloride 0.9 % bolus 1,000 mL (1,000 mLs Intravenous New Bag/Given 12/21/17 2326)  sodium chloride 0.9 % bolus 500 mL (500 mLs Intravenous New Bag/Given 12/21/17 2326)  ceFAZolin (ANCEF) IVPB 1 g/50 mL premix (0 g Intravenous  Stopped 12/22/17 0002)  potassium chloride SA (K-DUR,KLOR-CON) CR tablet 40 mEq (40 mEq Oral Given 12/22/17 0013)     Initial Impression / Assessment and Plan / ED Course  I have reviewed the triage vital signs and the nursing notes.  Pertinent labs & imaging results that were available during my care of the patient were reviewed by me and considered in my medical decision making (see chart for details).     Patient was started on oral potassium for his hypokalemia.  He was started on IV Ancef for his cellulitis/lymphadenitis.  He does not have a history of prior MRSA infection or abscesses.  Due to his elevated lactic acid he was given IV fluid boluses.  Patient was given IV fluid boluses, he was started on Ancef for his cellulitis/lymphadenitis.  I discussed admission with the patient and he is agreeable.  X-ray was obtained of his finger and additional blood work, sed rate and CRP was ordered.  At this point I do not suspect the patient has osteomyelitis because this just started  within the last 24 hours however he does have significant cellulitis and lymphadenitis.  Patient denies doing any yard work and specifically asked him about rosebushes, he also has a cat however he denies any injury from his cat.  He states he is never had anything like this happen before.  12:00 AM Dr Ara Kussmaul, hospitalist, will admit   Final Clinical Impressions(s) / ED Diagnoses   Final diagnoses:  Lymphadenitis  Cellulitis of finger of left hand  Cellulitis of left hand    Plan admission  Rolland Porter, MD, Barbette Or, MD 12/22/17 0430

## 2017-12-21 NOTE — ED Notes (Signed)
Informed nurse 1st of elevated lactic result.

## 2017-12-21 NOTE — ED Notes (Signed)
Pt. To XRAY via stretcher. 

## 2017-12-21 NOTE — ED Triage Notes (Signed)
Pt endorses waking up yesterday morning with itching to left middle finger, pt scratched area and now it is blistered with swelling and redness moving up the left arm. Sent here from [facility] for possible bone infection. VSS.

## 2017-12-22 ENCOUNTER — Encounter (HOSPITAL_COMMUNITY)
Admission: EM | Disposition: A | Discharge status: Home Health | Payer: Self-pay | Source: Home / Self Care | Attending: Internal Medicine

## 2017-12-22 ENCOUNTER — Other Ambulatory Visit: Payer: Self-pay

## 2017-12-22 ENCOUNTER — Inpatient Hospital Stay (HOSPITAL_COMMUNITY): Payer: Medicare Other | Admitting: Certified Registered Nurse Anesthetist

## 2017-12-22 DIAGNOSIS — L03114 Cellulitis of left upper limb: Secondary | ICD-10-CM | POA: Diagnosis present

## 2017-12-22 DIAGNOSIS — I889 Nonspecific lymphadenitis, unspecified: Secondary | ICD-10-CM

## 2017-12-22 DIAGNOSIS — F17210 Nicotine dependence, cigarettes, uncomplicated: Secondary | ICD-10-CM | POA: Diagnosis present

## 2017-12-22 DIAGNOSIS — E876 Hypokalemia: Secondary | ICD-10-CM | POA: Diagnosis present

## 2017-12-22 DIAGNOSIS — B9562 Methicillin resistant Staphylococcus aureus infection as the cause of diseases classified elsewhere: Secondary | ICD-10-CM | POA: Diagnosis present

## 2017-12-22 DIAGNOSIS — Z7982 Long term (current) use of aspirin: Secondary | ICD-10-CM | POA: Diagnosis not present

## 2017-12-22 DIAGNOSIS — L03012 Cellulitis of left finger: Secondary | ICD-10-CM | POA: Diagnosis present

## 2017-12-22 DIAGNOSIS — Z8546 Personal history of malignant neoplasm of prostate: Secondary | ICD-10-CM | POA: Diagnosis not present

## 2017-12-22 DIAGNOSIS — Z79899 Other long term (current) drug therapy: Secondary | ICD-10-CM | POA: Diagnosis not present

## 2017-12-22 DIAGNOSIS — I10 Essential (primary) hypertension: Secondary | ICD-10-CM | POA: Diagnosis present

## 2017-12-22 DIAGNOSIS — E785 Hyperlipidemia, unspecified: Secondary | ICD-10-CM | POA: Diagnosis present

## 2017-12-22 DIAGNOSIS — M65842 Other synovitis and tenosynovitis, left hand: Secondary | ICD-10-CM | POA: Diagnosis present

## 2017-12-22 HISTORY — PX: I & D EXTREMITY: SHX5045

## 2017-12-22 LAB — CBC
HCT: 38.8 % — ABNORMAL LOW (ref 39.0–52.0)
HEMATOCRIT: 38.7 % — AB (ref 39.0–52.0)
HEMOGLOBIN: 13 g/dL (ref 13.0–17.0)
Hemoglobin: 13 g/dL (ref 13.0–17.0)
MCH: 20.7 pg — AB (ref 26.0–34.0)
MCH: 20.7 pg — AB (ref 26.0–34.0)
MCHC: 33.5 g/dL (ref 30.0–36.0)
MCHC: 33.6 g/dL (ref 30.0–36.0)
MCV: 61.7 fL — ABNORMAL LOW (ref 78.0–100.0)
MCV: 61.7 fL — ABNORMAL LOW (ref 78.0–100.0)
PLATELETS: 222 10*3/uL (ref 150–400)
Platelets: 230 10*3/uL (ref 150–400)
RBC: 6.27 MIL/uL — ABNORMAL HIGH (ref 4.22–5.81)
RBC: 6.29 MIL/uL — ABNORMAL HIGH (ref 4.22–5.81)
RDW: 16.4 % — AB (ref 11.5–15.5)
RDW: 16.6 % — ABNORMAL HIGH (ref 11.5–15.5)
WBC: 12.9 10*3/uL — ABNORMAL HIGH (ref 4.0–10.5)
WBC: 13.1 10*3/uL — ABNORMAL HIGH (ref 4.0–10.5)

## 2017-12-22 LAB — MAGNESIUM: MAGNESIUM: 1.7 mg/dL (ref 1.7–2.4)

## 2017-12-22 LAB — BASIC METABOLIC PANEL
Anion gap: 11 (ref 5–15)
BUN: 7 mg/dL (ref 6–20)
CALCIUM: 8.2 mg/dL — AB (ref 8.9–10.3)
CO2: 27 mmol/L (ref 22–32)
Chloride: 102 mmol/L (ref 101–111)
Creatinine, Ser: 0.86 mg/dL (ref 0.61–1.24)
GFR calc Af Amer: >60 mL/min (ref >60)
GFR calc non Af Amer: >60 mL/min (ref >60)
GLUCOSE: 117 mg/dL — AB (ref 65–99)
Potassium: 3.2 mmol/L — ABNORMAL LOW (ref 3.5–5.1)
Sodium: 140 mmol/L (ref 135–145)

## 2017-12-22 LAB — SURGICAL PCR SCREEN
MRSA, PCR: NEGATIVE
Staphylococcus aureus: NEGATIVE

## 2017-12-22 LAB — PHOSPHORUS: Phosphorus: 2.8 mg/dL (ref 2.5–4.6)

## 2017-12-22 LAB — C-REACTIVE PROTEIN: CRP: 2.6 mg/dL — AB (ref <1.0)

## 2017-12-22 LAB — SEDIMENTATION RATE: Sed Rate: 9 mm/hr (ref 0–16)

## 2017-12-22 SURGERY — IRRIGATION AND DEBRIDEMENT EXTREMITY
Anesthesia: General | Site: Middle Finger | Laterality: Left

## 2017-12-22 MED ORDER — CEFAZOLIN SODIUM-DEXTROSE 1-4 GM/50ML-% IV SOLN
1.0000 g | Freq: Three times a day (TID) | INTRAVENOUS | Status: DC
  Administered 2017-12-22 – 2017-12-24 (×5): 1 g via INTRAVENOUS
  Filled 2017-12-22 (×7): qty 50

## 2017-12-22 MED ORDER — VANCOMYCIN HCL IN DEXTROSE 1-5 GM/200ML-% IV SOLN
1000.0000 mg | Freq: Once | INTRAVENOUS | Status: AC
  Administered 2017-12-22: 1000 mg via INTRAVENOUS
  Filled 2017-12-22: qty 200

## 2017-12-22 MED ORDER — 0.9 % SODIUM CHLORIDE (POUR BTL) OPTIME
TOPICAL | Status: DC | PRN
  Administered 2017-12-22: 1000 mL

## 2017-12-22 MED ORDER — ASPIRIN EC 81 MG PO TBEC
81.0000 mg | DELAYED_RELEASE_TABLET | Freq: Every day | ORAL | Status: DC
  Administered 2017-12-22 – 2017-12-24 (×3): 81 mg via ORAL
  Filled 2017-12-22 (×3): qty 1

## 2017-12-22 MED ORDER — PROPOFOL 10 MG/ML IV BOLUS
INTRAVENOUS | Status: AC
  Filled 2017-12-22: qty 20

## 2017-12-22 MED ORDER — LIDOCAINE HCL (CARDIAC) PF 100 MG/5ML IV SOSY
PREFILLED_SYRINGE | INTRAVENOUS | Status: DC | PRN
  Administered 2017-12-22: 80 mg via INTRAVENOUS

## 2017-12-22 MED ORDER — DEXAMETHASONE SODIUM PHOSPHATE 10 MG/ML IJ SOLN
INTRAMUSCULAR | Status: AC
  Filled 2017-12-22: qty 1

## 2017-12-22 MED ORDER — SIMVASTATIN 20 MG PO TABS
20.0000 mg | ORAL_TABLET | Freq: Every evening | ORAL | Status: DC
  Administered 2017-12-22 – 2017-12-23 (×2): 20 mg via ORAL
  Filled 2017-12-22 (×2): qty 1

## 2017-12-22 MED ORDER — ONDANSETRON HCL 4 MG/2ML IJ SOLN
INTRAMUSCULAR | Status: AC
  Filled 2017-12-22: qty 2

## 2017-12-22 MED ORDER — HYDROCHLOROTHIAZIDE 25 MG PO TABS: 25.0000 mg | ORAL_TABLET | Freq: Every day | ORAL | Status: DC

## 2017-12-22 MED ORDER — CEFAZOLIN SODIUM-DEXTROSE 1-4 GM/50ML-% IV SOLN
1.0000 g | Freq: Three times a day (TID) | INTRAVENOUS | Status: DC
  Administered 2017-12-22: 1 g via INTRAVENOUS
  Filled 2017-12-22 (×2): qty 50

## 2017-12-22 MED ORDER — DEXAMETHASONE SODIUM PHOSPHATE 4 MG/ML IJ SOLN
INTRAMUSCULAR | Status: DC | PRN
  Administered 2017-12-22: 10 mg via INTRAVENOUS

## 2017-12-22 MED ORDER — SUCCINYLCHOLINE CHLORIDE 200 MG/10ML IV SOSY
PREFILLED_SYRINGE | INTRAVENOUS | Status: AC
  Filled 2017-12-22: qty 10

## 2017-12-22 MED ORDER — ONDANSETRON HCL 4 MG/2ML IJ SOLN: 4.0000 mg | Freq: Once | INTRAMUSCULAR | Status: DC | PRN

## 2017-12-22 MED ORDER — MUPIROCIN 2 % EX OINT
1.0000 "application " | TOPICAL_OINTMENT | Freq: Two times a day (BID) | CUTANEOUS | Status: DC
  Administered 2017-12-23 – 2017-12-24 (×2): 1 via NASAL
  Filled 2017-12-22: qty 22

## 2017-12-22 MED ORDER — ACETAMINOPHEN 10 MG/ML IV SOLN
INTRAVENOUS | Status: AC
  Filled 2017-12-22: qty 100

## 2017-12-22 MED ORDER — PROPOFOL 10 MG/ML IV BOLUS
INTRAVENOUS | Status: DC | PRN
  Administered 2017-12-22: 50 mg via INTRAVENOUS
  Administered 2017-12-22: 100 mg via INTRAVENOUS

## 2017-12-22 MED ORDER — BISACODYL 5 MG PO TBEC: 5.0000 mg | DELAYED_RELEASE_TABLET | Freq: Every day | ORAL | Status: DC | PRN

## 2017-12-22 MED ORDER — LIDOCAINE 2% (20 MG/ML) 5 ML SYRINGE
INTRAMUSCULAR | Status: AC
  Filled 2017-12-22: qty 5

## 2017-12-22 MED ORDER — MAGNESIUM CITRATE PO SOLN: 1.0000 | Freq: Once | ORAL | Status: DC | PRN

## 2017-12-22 MED ORDER — MORPHINE SULFATE (PF) 2 MG/ML IV SOLN
0.5000 mg | INTRAVENOUS | Status: DC | PRN
  Administered 2017-12-22: 0.5 mg via INTRAVENOUS
  Filled 2017-12-22: qty 1

## 2017-12-22 MED ORDER — ONDANSETRON HCL 4 MG PO TABS: 4.0000 mg | ORAL_TABLET | Freq: Four times a day (QID) | ORAL | Status: DC | PRN

## 2017-12-22 MED ORDER — POTASSIUM CHLORIDE CRYS ER 20 MEQ PO TBCR
20.0000 meq | EXTENDED_RELEASE_TABLET | Freq: Two times a day (BID) | ORAL | Status: AC
  Administered 2017-12-22 (×2): 20 meq via ORAL
  Filled 2017-12-22 (×2): qty 1

## 2017-12-22 MED ORDER — FENTANYL CITRATE (PF) 100 MCG/2ML IJ SOLN: 25.0000 ug | INTRAMUSCULAR | Status: DC | PRN

## 2017-12-22 MED ORDER — CEFAZOLIN SODIUM 1 G IJ SOLR
INTRAMUSCULAR | Status: AC
  Filled 2017-12-22: qty 40

## 2017-12-22 MED ORDER — ENOXAPARIN SODIUM 40 MG/0.4ML ~~LOC~~ SOLN
40.0000 mg | SUBCUTANEOUS | Status: DC
  Administered 2017-12-23 – 2017-12-24 (×2): 40 mg via SUBCUTANEOUS
  Filled 2017-12-22 (×2): qty 0.4

## 2017-12-22 MED ORDER — CEFAZOLIN SODIUM-DEXTROSE 2-3 GM-%(50ML) IV SOLR
INTRAVENOUS | Status: DC | PRN
  Administered 2017-12-22: 2 g via INTRAVENOUS

## 2017-12-22 MED ORDER — POTASSIUM CHLORIDE CRYS ER 20 MEQ PO TBCR
20.0000 meq | EXTENDED_RELEASE_TABLET | Freq: Once | ORAL | Status: AC
  Administered 2017-12-22: 20 meq via ORAL
  Filled 2017-12-22: qty 1

## 2017-12-22 MED ORDER — PHENYLEPHRINE 40 MCG/ML (10ML) SYRINGE FOR IV PUSH (FOR BLOOD PRESSURE SUPPORT)
PREFILLED_SYRINGE | INTRAVENOUS | Status: AC
  Filled 2017-12-22: qty 20

## 2017-12-22 MED ORDER — SODIUM CHLORIDE 0.9 % IR SOLN
Status: DC | PRN
  Administered 2017-12-22: 3000 mL

## 2017-12-22 MED ORDER — FENTANYL CITRATE (PF) 250 MCG/5ML IJ SOLN
INTRAMUSCULAR | Status: AC
  Filled 2017-12-22: qty 5

## 2017-12-22 MED ORDER — PHENYLEPHRINE HCL 10 MG/ML IJ SOLN
INTRAMUSCULAR | Status: DC | PRN
  Administered 2017-12-22: 88 ug via INTRAVENOUS

## 2017-12-22 MED ORDER — IPRATROPIUM BROMIDE 0.02 % IN SOLN: 0.5000 mg | Freq: Four times a day (QID) | RESPIRATORY_TRACT | Status: DC | PRN

## 2017-12-22 MED ORDER — VANCOMYCIN HCL IN DEXTROSE 750-5 MG/150ML-% IV SOLN
750.0000 mg | Freq: Two times a day (BID) | INTRAVENOUS | Status: DC
  Administered 2017-12-22 – 2017-12-24 (×4): 750 mg via INTRAVENOUS
  Filled 2017-12-22 (×5): qty 150

## 2017-12-22 MED ORDER — TRAZODONE HCL 50 MG PO TABS: 25.0000 mg | ORAL_TABLET | Freq: Every evening | ORAL | Status: DC | PRN

## 2017-12-22 MED ORDER — ACETAMINOPHEN 10 MG/ML IV SOLN
INTRAVENOUS | Status: DC | PRN
  Administered 2017-12-22: 1000 mg via INTRAVENOUS

## 2017-12-22 MED ORDER — ALBUTEROL SULFATE (2.5 MG/3ML) 0.083% IN NEBU: 2.5000 mg | INHALATION_SOLUTION | Freq: Four times a day (QID) | RESPIRATORY_TRACT | Status: DC | PRN

## 2017-12-22 MED ORDER — VITAMIN B-12 1000 MCG PO TABS
1000.0000 ug | ORAL_TABLET | Freq: Every day | ORAL | Status: DC
  Administered 2017-12-23 – 2017-12-24 (×2): 1000 ug via ORAL
  Filled 2017-12-22 (×2): qty 1

## 2017-12-22 MED ORDER — SENNOSIDES-DOCUSATE SODIUM 8.6-50 MG PO TABS: 1.0000 | ORAL_TABLET | Freq: Every evening | ORAL | Status: DC | PRN

## 2017-12-22 MED ORDER — ONDANSETRON HCL 4 MG/2ML IJ SOLN
4.0000 mg | Freq: Four times a day (QID) | INTRAMUSCULAR | Status: DC | PRN
  Administered 2017-12-22: 4 mg via INTRAVENOUS

## 2017-12-22 MED ORDER — LACTATED RINGERS IV SOLN
INTRAVENOUS | Status: DC
Start: 2017-12-22 — End: 2017-12-22
  Administered 2017-12-22 (×2): via INTRAVENOUS

## 2017-12-22 MED ORDER — FENTANYL CITRATE (PF) 100 MCG/2ML IJ SOLN
INTRAMUSCULAR | Status: DC | PRN
  Administered 2017-12-22: 50 ug via INTRAVENOUS

## 2017-12-22 MED ORDER — ACETAMINOPHEN 325 MG PO TABS
650.0000 mg | ORAL_TABLET | Freq: Four times a day (QID) | ORAL | Status: DC | PRN
  Administered 2017-12-22 – 2017-12-24 (×4): 650 mg via ORAL
  Filled 2017-12-22 (×4): qty 2

## 2017-12-22 MED ORDER — SODIUM CHLORIDE 0.9 % IV SOLN
INTRAVENOUS | Status: DC
  Administered 2017-12-22 – 2017-12-23 (×2): via INTRAVENOUS

## 2017-12-22 MED ORDER — OXYCODONE HCL 5 MG PO TABS
5.0000 mg | ORAL_TABLET | ORAL | Status: DC | PRN
  Administered 2017-12-22 – 2017-12-23 (×4): 5 mg via ORAL
  Filled 2017-12-22 (×4): qty 1

## 2017-12-22 MED ORDER — ACETAMINOPHEN 650 MG RE SUPP: 650.0000 mg | Freq: Four times a day (QID) | RECTAL | Status: DC | PRN

## 2017-12-22 MED ORDER — BUPIVACAINE HCL (PF) 0.25 % IJ SOLN
INTRAMUSCULAR | Status: AC
  Filled 2017-12-22: qty 30

## 2017-12-22 MED ORDER — BUPIVACAINE HCL 0.25 % IJ SOLN
INTRAMUSCULAR | Status: DC | PRN
  Administered 2017-12-22: 10 mL

## 2017-12-22 SURGICAL SUPPLY — 52 items
BANDAGE ACE 4X5 VEL STRL LF (GAUZE/BANDAGES/DRESSINGS) ×2 IMPLANT
BANDAGE ACE 6X5 VEL STRL LF (GAUZE/BANDAGES/DRESSINGS) ×1 IMPLANT
BNDG CONFORM 2 STRL LF (GAUZE/BANDAGES/DRESSINGS) ×1 IMPLANT
BNDG GAUZE ELAST 4 BULKY (GAUZE/BANDAGES/DRESSINGS) ×3 IMPLANT
CABLE BIPOLOR RESECTION CORD (MISCELLANEOUS) ×1 IMPLANT
CONT SPEC 4OZ CLIKSEAL STRL BL (MISCELLANEOUS) ×1 IMPLANT
COVER SURGICAL LIGHT HANDLE (MISCELLANEOUS) ×2 IMPLANT
CUFF TOURN SGL LL 12 NO SLV (MISCELLANEOUS) IMPLANT
CUFF TOURNIQUET SINGLE 18IN (TOURNIQUET CUFF) ×1 IMPLANT
CUFF TOURNIQUET SINGLE 34IN LL (TOURNIQUET CUFF) IMPLANT
DRAPE U-SHAPE 47X51 STRL (DRAPES) IMPLANT
DRSG PAD ABDOMINAL 8X10 ST (GAUZE/BANDAGES/DRESSINGS) ×1 IMPLANT
DURAPREP 26ML APPLICATOR (WOUND CARE) ×1 IMPLANT
ELECT REM PT RETURN 9FT ADLT (ELECTROSURGICAL) ×2
ELECTRODE REM PT RTRN 9FT ADLT (ELECTROSURGICAL) IMPLANT
GAUZE SPONGE 4X4 12PLY STRL (GAUZE/BANDAGES/DRESSINGS) ×1 IMPLANT
GAUZE SPONGE 4X4 12PLY STRL LF (GAUZE/BANDAGES/DRESSINGS) ×1 IMPLANT
GAUZE XEROFORM 1X8 LF (GAUZE/BANDAGES/DRESSINGS) ×2 IMPLANT
GLOVE BIOGEL PI IND STRL 8 (GLOVE) ×1 IMPLANT
GLOVE BIOGEL PI INDICATOR 8 (GLOVE) ×1
GLOVE ECLIPSE 8.0 STRL XLNG CF (GLOVE) ×4 IMPLANT
GOWN STRL REUS W/ TWL LRG LVL3 (GOWN DISPOSABLE) ×1 IMPLANT
GOWN STRL REUS W/ TWL XL LVL3 (GOWN DISPOSABLE) ×2 IMPLANT
GOWN STRL REUS W/TWL LRG LVL3 (GOWN DISPOSABLE) ×2
GOWN STRL REUS W/TWL XL LVL3 (GOWN DISPOSABLE) ×2
HANDPIECE INTERPULSE COAX TIP (DISPOSABLE)
KIT BASIN OR (CUSTOM PROCEDURE TRAY) ×2 IMPLANT
KIT TURNOVER KIT B (KITS) ×2 IMPLANT
LOOP VESSEL MAXI BLUE (MISCELLANEOUS) ×1 IMPLANT
MANIFOLD NEPTUNE II (INSTRUMENTS) ×2 IMPLANT
NDL HYPO 25GX1X1/2 BEV (NEEDLE) IMPLANT
NEEDLE HYPO 25GX1X1/2 BEV (NEEDLE) ×2 IMPLANT
NS IRRIG 1000ML POUR BTL (IV SOLUTION) ×2 IMPLANT
PACK ORTHO EXTREMITY (CUSTOM PROCEDURE TRAY) ×2 IMPLANT
PAD ARMBOARD 7.5X6 YLW CONV (MISCELLANEOUS) ×4 IMPLANT
PAD CAST 4YDX4 CTTN HI CHSV (CAST SUPPLIES) ×1 IMPLANT
PADDING CAST COTTON 4X4 STRL (CAST SUPPLIES)
PADDING CAST COTTON 6X4 STRL (CAST SUPPLIES) ×1 IMPLANT
SET HNDPC FAN SPRY TIP SCT (DISPOSABLE) IMPLANT
SPONGE LAP 18X18 X RAY DECT (DISPOSABLE) IMPLANT
SUCTION FRAZIER TIP 8 FR DISP (SUCTIONS) ×1
SUCTION TUBE FRAZIER 8FR DISP (SUCTIONS) IMPLANT
SUT ETHILON 2 0 FS 18 (SUTURE) ×1 IMPLANT
SUT ETHILON 3 0 PS 1 (SUTURE) ×1 IMPLANT
SWAB COLLECTION DEVICE MRSA (MISCELLANEOUS) ×1 IMPLANT
SWAB CULTURE ESWAB REG 1ML (MISCELLANEOUS) ×1 IMPLANT
SYR CONTROL 10ML LL (SYRINGE) ×1 IMPLANT
TOWEL OR 17X24 6PK STRL BLUE (TOWEL DISPOSABLE) ×1 IMPLANT
TOWEL OR 17X26 10 PK STRL BLUE (TOWEL DISPOSABLE) ×2 IMPLANT
TUBE CONNECTING 12X1/4 (SUCTIONS) ×2 IMPLANT
UNDERPAD 30X30 (UNDERPADS AND DIAPERS) ×2 IMPLANT
YANKAUER SUCT BULB TIP NO VENT (SUCTIONS) ×2 IMPLANT

## 2017-12-22 NOTE — Op Note (Signed)
Orthopaedic Surgery Operative Note (CSN: [id number])  Scott Cruz  1946-02-03 Date of Surgery: 12/21/2017 - 12/22/2017   Diagnoses:  Left flexor tenosynovitis middle finger and dorsal superficial debridement  Procedure: 26145 - Flexor tenosynovectomy 26055 - A1 pulley release 10060 - abscess drainage   Operative Finding Successful completion of planned procedure.  Bulla on the dorsum of finger and extended from DIP joint to mid P1 and seem to only involve the superficial epidermis.  Area was debrided.  Flexor sheath appeared to be involved with the long finger and A1 pulley was released and irrigant passed through flexor sheath until running clear.  Post-operative plan: The patient will be started on daily hydrotherapy.  The patient will be omitted back to medicine.  DVT prophylaxis per primary team as not indicated for upper extremity surgery.  Pain control with PRN pain medication preferring oral medicines.  Follow up plan will be scheduled in approximately 7 days for incision check .  Post-Op Diagnosis: Same Surgeons:Primary: Hiram Gash, MD Assistants:none Location: [facility] OR ROOM 08 Anesthesia: General Antibiotics: Ancef 2g after cultures taken, vancomycin running on floor Tourniquet time: 21 Estimated Blood Loss: Minimal Complications: None Specimens: 2 for culture, one from dorsum of hand and one from volar flexor sheath Implants: * No implants in log *  Indications for Surgery:   Scott Cruz is a 72 y.o. male with 3 to 4 days of worsening swelling and loss of range of motion of the left long finger without obvious source.  She was admitted overnight to medicine and attempted nonoperative management with IV antibiotics but had worsening of the swelling and pain with a new fever.  Benefits and risks of operative and nonoperative management were discussed prior to surgery with patient/guardian(s) and informed consent form was completed.  Specific risks including infection, need for  additional surgery, continued infection, need for amputation, neurovascular injury, stiffness and wound healing complications   Procedure:   The patient was identified in the preoperative holding area where the surgical site was marked. The patient was taken to the OR where a procedural timeout was called and the above noted anesthesia was induced.  The patient was positioned supine on hand table.  Preoperative antibiotics were dosed after cultures were taken.  The patient's left hand was prepped and draped in the usual sterile fashion.  A second preoperative timeout was called.      A tourniquet was used for the above listed time.   We began with opening the dorsal bulla.  A small incision was made and this was noted to be just through the epidermal layers and obvious purulent material and fluid was collected for specimen.  This layer of epidermis then was noted to be sloughing and was removed sharply to avoid re-collection.  We then turned our attention to the flexor side.  A mid lateral incision was made over the distal phalanx on the ulnar side of the digit to avoid interference with pinch.  We dissected through subcutaneous tissues with blunt dissection using tenotomy scissors taking care to elevate the neurovascular bundle volar and identify the flexor sheath as it ended and inserted on the P3.  This point we turned our attention to the A1 pulley.  We identified the MP joint and made an oblique incision and dissected down sharply avoiding the neurovascular structures medially and laterally placing retractors to protect these and identify the A1 pulley.  A1 pulley was released and we were able to open the flexor sheath.  There did  appear to be purulent material in this area.  We then used a 18-gauge Angiocath to visually slide into the flexor sheath from the proximal incision and used about 200 mL of fluid to run through the flexor sheath until it was running clear.  This point we placed a drain  in the form of vessel loop in both the proximal and distal openings of the flexor tendon sheath and we loosely closed with one single stitch each incision.  Tourniquet was released and no significant bleeding was noted.  A bulky dressing was placed.  Hydrotherapy will start tomorrow.   The patient was awoken from general anesthesia and taken to the PACU in stable condition without complication.

## 2017-12-22 NOTE — H&P (View-Only) (Signed)
ORTHOPAEDIC CONSULTATION  REQUESTING PHYSICIAN: Elmarie Shiley, MD  Chief Complaint: Left long finger infection  HPI: Scott Cruz is a 72 y.o. male right-hand dominant whose had 3 to 4 days of worsening swelling and pain in his left long finger.  No obvious injury.  Unsure why this had begun.  He reports no previous history of any sort of infections similar to this.  Medicine and kept him on IV antibiotics overnight and due to worsening of his bulla on the dorsal aspect of his finger orthopedics was called.  He is unable to bend his finger without pain.  He has some pain down into his palm but not into the arm clearly.  Past Medical History:  Diagnosis Date  . Hyperlipidemia   . Hypertension   . Prostate CA [facility])    Past Surgical History:  Procedure Laterality Date  . NO PAST SURGERIES     Social History   Socioeconomic History  . Marital status: Married    Spouse name: Not on file  . Number of children: Not on file  . Years of education: Not on file  . Highest education level: Not on file  Occupational History  . Not on file  Social Needs  . Financial resource strain: Not on file  . Food insecurity:    Worry: Not on file    Inability: Not on file  . Transportation needs:    Medical: Not on file    Non-medical: Not on file  Tobacco Use  . Smoking status: Current Every Day Smoker    Packs/day: 1.00    Years: 50.00    Pack years: 50.00    Types: Cigarettes  . Smokeless tobacco: Never Used  Substance and Sexual Activity  . Alcohol use: No  . Drug use: No  . Sexual activity: Not on file  Lifestyle  . Physical activity:    Days per week: Not on file    Minutes per session: Not on file  . Stress: Not on file  Relationships  . Social connections:    Talks on phone: Not on file    Gets together: Not on file    Attends religious service: Not on file    Active member of club or organization: Not on file    Attends meetings of clubs or organizations: Not on  file    Relationship status: Not on file  Other Topics Concern  . Not on file  Social History Narrative  . Not on file   History reviewed. No pertinent family history. No Known Allergies Prior to Admission medications   Medication Sig Start Date End Date Taking? Authorizing Provider  aspirin EC 81 MG tablet Take 81 mg by mouth daily.   Yes [provider]  hydrochlorothiazide (HYDRODIURIL) 25 MG tablet Take 25 mg by mouth daily.   Yes [provider]  simvastatin (ZOCOR) 20 MG tablet Take 20 mg by mouth every evening.   Yes [provider]  vitamin B-12 (CYANOCOBALAMIN) 1000 MCG tablet Take 1,000 mcg by mouth daily.   Yes [provider]   Dg Finger Middle Left  Result Date: 12/21/2017 CLINICAL DATA:  Blistering of the left middle finger. EXAM: LEFT MIDDLE FINGER 2+V COMPARISON:  None. FINDINGS: No acute fracture nor bone destruction. Joint spaces are maintained. Soft tissue swelling over the dorsal and ulnar aspect of the left middle finger is identified at the level of the middle phalanx. IMPRESSION: Soft tissue swelling of the left middle  finger without underlying bone destruction or fracture. Electronically Signed   By: Ashley Royalty M.D.   On: 12/21/2017 23:54   Family History Reviewed and non-contributory, no pertinent history of problems with bleeding or anesthesia      Review of Systems 14 system ROS conducted and negative except for that noted in HPI   OBJECTIVE  Vitals: Patient Vitals for the past 8 hrs:  BP Temp Temp src Pulse Resp SpO2  12/22/17 0841 137/71 100 F (37.8 C) Oral 84 20 94 %  12/22/17 0458 123/71 99.1 F (37.3 C) Oral 72 18 91 %   General: Alert, no acute distress Cardiovascular: No pedal edema Respiratory: No cyanosis, no use of accessory musculature GI: No organomegaly, abdomen is soft and non-tender Skin: No lesions in the area of chief complaint other than those listed below in MSK exam.  Neurologic: Sensation  intact distally save for the below mentioned MSK exam Psychiatric: Patient is competent for consent with normal mood and affect Lymphatic: No axillary or cervical lymphadenopathy Extremities  LUE: Obvious swelling and tenderness palpation with redness and blanching erythema on the volar aspect of the finger and a large bulla that appears subcutaneous on the dorsal aspect of the long finger.  Patient has pain with passive stretch the finger is held in slight flexion.  Manger the fingers appear relatively normal.  Patient is tender down to the level of the A1 pulley.  + Motor in  AIN, PIN, Ulnar distributions. Sensation intact in medial, radial, and ulnar distributions. Negative tinel at carpal tunnel.  2+ radial pulse with warm and well perfused digits. Compartments soft and compressible.      Test Results Imaging X-rays demonstrate soft tissue swelling over the long finger but no other acute osseous abnormality.  Labs cbc Recent Labs    12/21/17 2050 12/22/17 0230  WBC 13.7* 12.9*  13.1*  HGB 14.6 13.0  13.0  HCT 42.1 38.7*  38.8*  PLT 215 230  222    Labs inflam Recent Labs    12/21/17 2315  CRP 2.6*    Labs coag No results for input(s): INR, PTT in the last 72 hours.  Invalid input(s): PT  Recent Labs    12/21/17 2050 12/22/17 0230  NA 136 140  K 2.8* 3.2*  CL 93* 102  CO2 29 27  GLUCOSE 163* 117*  BUN 11 7  CREATININE 0.95 0.86  CALCIUM 9.0 8.2*     ASSESSMENT AND PLAN: 72 y.o. male with the following: Left long finger swelling and pain concerning for flexor tenosynovitis versus other infectious source  Patient's fingers concerning for flexor tenosynovitis.  Due to the bulla on the dorsum of his finger feel the stat MRI would help [place] with understanding where surgical incisions may need to be placed.  We talked to him at length about the risk benefits and alternatives to surgery and he understands specific risks of continued infection, amputation, damage to  surrounding neurovascular structures and postop stiffness.  He understands a dressing change will be required daily postop.  We confirmed the patient that he was comfortable obtaining consent English and that he was understanding of all of our plan of care.  Stat MRI ordered, will post patient for surgery later this afternoon.

## 2017-12-22 NOTE — Progress Notes (Signed)
Pharmacy Antibiotic Note  Scott Cruz is a 72 y.o. male admitted on 12/21/2017 with cellulitis.  Pharmacy has been consulted for vancomycin dosing. Currently on cefazolin. Added vancomycin for sepsis picture. (Tm 100, WBC 13.7, BP 160/82). CrCl ~64.  Plan: Vancomycin 750 mg IV every 12 hours.  Goal trough 10-15 mcg/mL.  Continue cefazolin 1 gm every 12 hours. F/U cultures, LOT, and trough as needed.  Height: 5\' 4"  (162.6 cm) Weight: 145 lb (65.8 kg) IBW/kg (Calculated) : 59.2  Temp (24hrs), Avg:98.8 F (37.1 C), Min:97.9 F (36.6 C), Max:100 F (37.8 C)  Recent Labs  Lab 12/21/17 2050 12/21/17 2059 12/21/17 2313 12/22/17 0230  WBC 13.7*  --   --  12.9*  13.1*  CREATININE 0.95  --   --  0.86  LATICACIDVEN  --  2.47* 1.52  --     Estimated Creatinine Clearance: 64.1 mL/min (by C-G formula based on SCr of 0.86 mg/dL).    No Known Allergies  Thank you for allowing pharmacy to be a part of this patient's care.  Krystyl Cannell A Ikeya Brockel 12/22/2017 8:50 AM

## 2017-12-22 NOTE — Evaluation (Addendum)
Physical Therapy Evaluation & Discharge Patient Details Name: Scott Cruz MRN: [id number] DOB: 1946-01-26 Today's Date: 12/22/2017   History of Present Illness  Pt is a 72 y.o. male admitted 12/21/17 with finger swelling; worked up for cellulitis and lymphangitis of the left third digit. PMH includes HTN, HLD, prostate CA.    Clinical Impression  Patient evaluated by Physical Therapy with no further acute PT needs identified. PTA, pt indep and lives with family. Today, amb throughout room independently and 600' in hallway with supervision for safety. All education has been completed and the patient has no further questions. C/o L finger pain with soreness that extends to L shoulder; elbow/shoulder strength and ROM WFL. PT is signing off. Thank you for this referral.  If pt to have surgery on L hand, recommend acute OT consult for evaluation prior to d/c.     Follow Up Recommendations No PT follow up;Supervision - Intermittent    Equipment Recommendations  None recommended by PT    Recommendations for Other Services       Precautions / Restrictions Precautions Precautions: Fall Restrictions Weight Bearing Restrictions: No      Mobility  Bed Mobility Overal bed mobility: Independent                Transfers Overall transfer level: Independent                  Ambulation/Gait Ambulation/Gait assistance: Supervision;Independent Ambulation Distance (Feet): 600 Feet Assistive device: None Gait Pattern/deviations: Step-through pattern;Decreased stride length Gait velocity: Decreased Gait velocity interpretation: <1.8 ft/sec, indicate of risk for recurrent falls General Gait Details: Slow, steady amb with supervision for balance. Slight instability initially, improving with distance; no LOB  Stairs            Wheelchair Mobility    Modified Rankin (Stroke Patients Only)       Balance Overall balance assessment: Needs assistance   Sitting  balance-Leahy Scale: Normal       Standing balance-Leahy Scale: Good                               Pertinent Vitals/Pain Pain Assessment: Faces Faces Pain Scale: Hurts even more Pain Location: L middle finger Pain Descriptors / Indicators: Sore;Burning Pain Intervention(s): Monitored during session    Home Living Family/patient expects to be discharged to:: Private residence Living Arrangements: Spouse/significant other;Other relatives Available Help at Discharge: Family;Available 24 hours/day Type of Home: House Home Access: Level entry     Home Layout: Able to live on main level with bedroom/bathroom Home Equipment: None      Prior Function Level of Independence: Independent         Comments: Retired from work (worked housekeeping at [employer]). Drives     Hand Dominance        Extremity/Trunk Assessment   Upper Extremity Assessment Upper Extremity Assessment: LUE deficits/detail LUE Deficits / Details: L middle finger swelling/bruising; LUE strength WFL    Lower Extremity Assessment Lower Extremity Assessment: Overall WFL for tasks assessed       Communication      Cognition Arousal/Alertness: Awake/alert Behavior During Therapy: WFL for tasks assessed/performed Overall Cognitive Status: Within Functional Limits for tasks assessed                                        General Comments  Exercises     Assessment/Plan    PT Assessment Patent does not need any further PT services  PT Problem List         PT Treatment Interventions      PT Goals (Current goals can be found in the Care Plan section)  Acute Rehab PT Goals PT Goal Formulation: All assessment and education complete, DC therapy    Frequency     Barriers to discharge        Co-evaluation               AM-PAC PT "6 Clicks" Daily Activity  Outcome Measure Difficulty turning over in bed (including adjusting bedclothes, sheets and  blankets)?: None Difficulty moving from lying on back to sitting on the side of the bed? : None Difficulty sitting down on and standing up from a chair with arms (e.g., wheelchair, bedside commode, etc,.)?: None Help needed moving to and from a bed to chair (including a wheelchair)?: None Help needed walking in hospital room?: A Little Help needed climbing 3-5 steps with a railing? : A Little 6 Click Score: 22    End of Session Equipment Utilized During Treatment: Gait belt Activity Tolerance: Patient tolerated treatment well Patient left: in bed;with call bell/phone within reach Nurse Communication: Mobility status PT Visit Diagnosis: Other abnormalities of gait and mobility (R26.89)    Time: 7948-0165 PT Time Calculation (min) (ACUTE ONLY): 12 min   Charges:   PT Evaluation $PT Eval Low Complexity: 1 Low     PT G Codes:       Mabeline Caras, PT, DPT Acute Rehab Services  Pager: [place] 12/22/2017, 9:22 AM

## 2017-12-22 NOTE — Progress Notes (Signed)
PROGRESS NOTE    Scott Cruz  ZWC:[id number] DOB: January 23, 1946 DOA: 12/21/2017 PCP: Gaynelle Arabian, MD    Brief Narrative:Scott Cruz is a 72 y.o. male with a known history of HTN, HLD, prostate cancer presents to the emergency department for evaluation of finger swelling.  Patient was in a usual state of health until yesterday morning when he describes itching of the dorsal aspect of his left third digit at the PIP with subsequent redness, swelling and pain which now extends up into the arm. He reports associated chills.  He denies any wounds, trauma.  Patient denies fevers, weakness, dizziness, chest pain, shortness of breath, N/V/C/D, abdominal pain, dysuria/frequency, changes in mental status.    Otherwise there has been no change in status. Patient has been taking medication as prescribed and there has been no recent change in medication or diet.  No recent antibiotics.  There has been no recent illness, hospitalizations, travel or sick contacts.    EMS/ED Course: Patient received Ancef. Medical admission has been requested for further management of cellulitis / lymphangitis of the left middle finger.    Assessment & Plan:   Active Problems:   Cellulitis of left middle finger   1-Left middle finger infection, left hand infection.  Bullae increase in size this am, edema and redness left hand dorsal.  Orhto consulted. Will need Sx.  On ancef. Will add vancomycin.   2-hypokalemia; replete orally.   3-HTN; hold HCTZ to avoid hypotension.   4-HLD; continue with lipitor.   DVT prophylaxis: lovenox Code Status: full code.  Family Communication:  Son in Sports coach at bedside.  Disposition Plan: home when stable.    Consultants:   Ortho   Procedures:  Sx flexor tenosynovectomy.  Abscess drainage.   Antimicrobials: ancef 5-11 Vancomycin 5-11  Subjective: He is complaining of hand, finger pain. Bullae is bigger.   Objective: Vitals:   12/22/17 0015 12/22/17 0123 12/22/17 0458  12/22/17 0841  BP: (!) 131/55 (!) 141/79 123/71 137/71  Pulse: 73 72 72 84  Resp:  20 18 20   Temp:  98 F (36.7 C) 99.1 F (37.3 C) 100 F (37.8 C)  TempSrc:  Oral Oral Oral  SpO2: 97% 99% 91% 94%  Weight:      Height:        Intake/Output Summary (Last 24 hours) at 12/22/2017 0842 Last data filed at 12/22/2017 0603 Gross per 24 hour  Intake 1513 ml  Output -  Net 1513 ml   Filed Weights   12/21/17 2033  Weight: 65.8 kg (145 lb)    Examination:  General exam: Appears calm and comfortable  Respiratory system: Clear to auscultation. Respiratory effort normal. Cardiovascular system: S1 & S2 heard, RRR. No JVD, murmurs, rubs, gallops or clicks. No pedal edema. Gastrointestinal system: Abdomen is nondistended, soft and nontender. No organomegaly or masses felt. Normal bowel sounds heard. Central nervous system: Alert and oriented. No focal neurological deficits. Extremities: Symmetric 5 x 5 power. Skin: left middle finger with large bullae, edema and redness dorsal hand    Data Reviewed: I have personally reviewed following labs and imaging studies  CBC: Recent Labs  Lab 12/21/17 2050 12/22/17 0230  WBC 13.7* 12.9*  13.1*  NEUTROABS 10.1*  --   HGB 14.6 13.0  13.0  HCT 42.1 38.7*  38.8*  MCV 61.9* 61.7*  61.7*  PLT 215 230  235   Basic Metabolic Panel: Recent Labs  Lab 12/21/17 2050 12/22/17 0230  NA 136 140  K 2.8* 3.2*  CL 93* 102  CO2 29 27  GLUCOSE 163* 117*  BUN 11 7  CREATININE 0.95 0.86  CALCIUM 9.0 8.2*  MG  --  1.7  PHOS  --  2.8   GFR: Estimated Creatinine Clearance: 64.1 mL/min (by C-G formula based on SCr of 0.86 mg/dL). Liver Function Tests: Recent Labs  Lab 12/21/17 2050  AST 31  ALT 26  ALKPHOS 42  BILITOT 1.1  PROT 7.5  ALBUMIN 4.3   No results for input(s): LIPASE, AMYLASE in the last 168 hours. No results for input(s): AMMONIA in the last 168 hours. Coagulation Profile: No results for input(s): INR, PROTIME in the  last 168 hours. Cardiac Enzymes: No results for input(s): CKTOTAL, CKMB, CKMBINDEX, TROPONINI in the last 168 hours. BNP (last 3 results) No results for input(s): PROBNP in the last 8760 hours. HbA1C: No results for input(s): HGBA1C in the last 72 hours. CBG: No results for input(s): GLUCAP in the last 168 hours. Lipid Profile: No results for input(s): CHOL, HDL, LDLCALC, TRIG, CHOLHDL, LDLDIRECT in the last 72 hours. Thyroid Function Tests: No results for input(s): TSH, T4TOTAL, FREET4, T3FREE, THYROIDAB in the last 72 hours. Anemia Panel: No results for input(s): VITAMINB12, FOLATE, FERRITIN, TIBC, IRON, RETICCTPCT in the last 72 hours. Sepsis Labs: Recent Labs  Lab 12/21/17 2059 12/21/17 2313  LATICACIDVEN 2.47* 1.52    No results found for this or any previous visit (from the past 240 hour(s)).       Radiology Studies: Dg Finger Middle Left  Result Date: 12/21/2017 CLINICAL DATA:  Blistering of the left middle finger. EXAM: LEFT MIDDLE FINGER 2+V COMPARISON:  None. FINDINGS: No acute fracture nor bone destruction. Joint spaces are maintained. Soft tissue swelling over the dorsal and ulnar aspect of the left middle finger is identified at the level of the middle phalanx. IMPRESSION: Soft tissue swelling of the left middle finger without underlying bone destruction or fracture. Electronically Signed   By: Ashley Royalty M.D.   On: 12/21/2017 23:54        Scheduled Meds: . aspirin EC  81 mg Oral Daily  . enoxaparin (LOVENOX) injection  40 mg Subcutaneous Q24H  . potassium chloride  20 mEq Oral BID  . simvastatin  20 mg Oral QPM  . vitamin B-12  1,000 mcg Oral Daily   Continuous Infusions: .  ceFAZolin (ANCEF) IV Stopped (12/22/17 0654)     LOS: 0 days    Time spent: 35 minutes     Elmarie Shiley, MD [facility] Pager [phone]  If 7PM-7AM, please contact night-coverage [email or web address] Password [facility] 12/22/2017, 8:42 AM

## 2017-12-22 NOTE — Anesthesia Preprocedure Evaluation (Addendum)
Anesthesia Evaluation  Patient identified by MRN, date of birth, ID band Patient awake    Reviewed: Allergy & Precautions, NPO status , Patient's Chart, lab work & pertinent test results  Airway Mallampati: II  TM Distance: >3 FB Neck ROM: Full    Dental  (+) Dental Advisory Given, Poor Dentition, Missing, Loose,    Pulmonary Current Smoker,    Pulmonary exam normal breath sounds clear to auscultation       Cardiovascular hypertension, Pt. on medications Normal cardiovascular exam Rhythm:Regular Rate:Normal     Neuro/Psych negative neurological ROS  negative psych ROS   GI/Hepatic negative GI ROS, Neg liver ROS,   Endo/Other  negative endocrine ROS  Renal/GU negative Renal ROS   Prostate cancer     Musculoskeletal negative musculoskeletal ROS (+)   Abdominal   Peds  Hematology negative hematology ROS (+)   Anesthesia Other Findings Day of surgery medications reviewed with the patient.  Reproductive/Obstetrics                            Anesthesia Physical Anesthesia Plan  ASA: II  Anesthesia Plan: General   Post-op Pain Management:    Induction: Intravenous  PONV Risk Score and Plan: 2 and Dexamethasone and Ondansetron  Airway Management Planned: Oral ETT  Additional Equipment:   Intra-op Plan:   Post-operative Plan: Extubation in OR  Informed Consent: I have reviewed the patients History and Physical, chart, labs and discussed the procedure including the risks, benefits and alternatives for the proposed anesthesia with the patient or authorized representative who has indicated his/her understanding and acceptance.   Dental advisory given  Plan Discussed with: CRNA  Anesthesia Plan Comments: (Risks/benefits of general anesthesia discussed with patient including risk of damage to teeth, lips, gum, and tongue, nausea/vomiting, allergic reactions to medications, and the  possibility of heart attack, stroke and death.  All patient questions answered.  Patient wishes to proceed.)        Anesthesia Quick Evaluation

## 2017-12-22 NOTE — Progress Notes (Signed)
Patient off floor to OR

## 2017-12-22 NOTE — Progress Notes (Signed)
Pharmacy Antibiotic Note  Scott Cruz is a 72 y.o. male admitted on 12/21/2017 with cellulitis.  Pharmacy has been consulted for vancomycin dosing. Currently on cefazolin. Added vancomycin for sepsis picture. (Tm 100, WBC 13.7, BP 160/82). CrCl ~64.  Plan: Vancomycin 1,000 mg IV loading dose x 1 Vancomycin 750 mg IV every 12 hours.  Goal trough 10-15 mcg/mL.  Continue cefazolin 1 gm every 12 hours. F/U cultures, LOT, and trough as needed.  Height: 5\' 4"  (162.6 cm) Weight: 145 lb (65.8 kg) IBW/kg (Calculated) : 59.2  Temp (24hrs), Avg:98.8 F (37.1 C), Min:97.9 F (36.6 C), Max:100 F (37.8 C)  Recent Labs  Lab 12/21/17 2050 12/21/17 2059 12/21/17 2313 12/22/17 0230  WBC 13.7*  --   --  12.9*  13.1*  CREATININE 0.95  --   --  0.86  LATICACIDVEN  --  2.47* 1.52  --     Estimated Creatinine Clearance: 64.1 mL/min (by C-G formula based on SCr of 0.86 mg/dL).    No Known Allergies  Thank you for allowing pharmacy to be a part of this patient's care.  Blaine Hamper Darren Caldron 12/22/2017 8:53 AM

## 2017-12-22 NOTE — Consult Note (Signed)
ORTHOPAEDIC CONSULTATION  REQUESTING PHYSICIAN: Elmarie Shiley, MD  Chief Complaint: Left long finger infection  HPI: Scott Cruz is a 72 y.o. male right-hand dominant whose had 3 to 4 days of worsening swelling and pain in his left long finger.  No obvious injury.  Unsure why this had begun.  He reports no previous history of any sort of infections similar to this.  Medicine and kept him on IV antibiotics overnight and due to worsening of his bulla on the dorsal aspect of his finger orthopedics was called.  He is unable to bend his finger without pain.  He has some pain down into his palm but not into the arm clearly.  Past Medical History:  Diagnosis Date  . Hyperlipidemia   . Hypertension   . Prostate CA [facility])    Past Surgical History:  Procedure Laterality Date  . NO PAST SURGERIES     Social History   Socioeconomic History  . Marital status: Married    Spouse name: Not on file  . Number of children: Not on file  . Years of education: Not on file  . Highest education level: Not on file  Occupational History  . Not on file  Social Needs  . Financial resource strain: Not on file  . Food insecurity:    Worry: Not on file    Inability: Not on file  . Transportation needs:    Medical: Not on file    Non-medical: Not on file  Tobacco Use  . Smoking status: Current Every Day Smoker    Packs/day: 1.00    Years: 50.00    Pack years: 50.00    Types: Cigarettes  . Smokeless tobacco: Never Used  Substance and Sexual Activity  . Alcohol use: No  . Drug use: No  . Sexual activity: Not on file  Lifestyle  . Physical activity:    Days per week: Not on file    Minutes per session: Not on file  . Stress: Not on file  Relationships  . Social connections:    Talks on phone: Not on file    Gets together: Not on file    Attends religious service: Not on file    Active member of club or organization: Not on file    Attends meetings of clubs or organizations: Not on  file    Relationship status: Not on file  Other Topics Concern  . Not on file  Social History Narrative  . Not on file   History reviewed. No pertinent family history. No Known Allergies Prior to Admission medications   Medication Sig Start Date End Date Taking? Authorizing Provider  aspirin EC 81 MG tablet Take 81 mg by mouth daily.   Yes [provider]  hydrochlorothiazide (HYDRODIURIL) 25 MG tablet Take 25 mg by mouth daily.   Yes [provider]  simvastatin (ZOCOR) 20 MG tablet Take 20 mg by mouth every evening.   Yes [provider]  vitamin B-12 (CYANOCOBALAMIN) 1000 MCG tablet Take 1,000 mcg by mouth daily.   Yes [provider]   Dg Finger Middle Left  Result Date: 12/21/2017 CLINICAL DATA:  Blistering of the left middle finger. EXAM: LEFT MIDDLE FINGER 2+V COMPARISON:  None. FINDINGS: No acute fracture nor bone destruction. Joint spaces are maintained. Soft tissue swelling over the dorsal and ulnar aspect of the left middle finger is identified at the level of the middle phalanx. IMPRESSION: Soft tissue swelling of the left middle  finger without underlying bone destruction or fracture. Electronically Signed   By: Ashley Royalty M.D.   On: 12/21/2017 23:54   Family History Reviewed and non-contributory, no pertinent history of problems with bleeding or anesthesia      Review of Systems 14 system ROS conducted and negative except for that noted in HPI   OBJECTIVE  Vitals: Patient Vitals for the past 8 hrs:  BP Temp Temp src Pulse Resp SpO2  12/22/17 0841 137/71 100 F (37.8 C) Oral 84 20 94 %  12/22/17 0458 123/71 99.1 F (37.3 C) Oral 72 18 91 %   General: Alert, no acute distress Cardiovascular: No pedal edema Respiratory: No cyanosis, no use of accessory musculature GI: No organomegaly, abdomen is soft and non-tender Skin: No lesions in the area of chief complaint other than those listed below in MSK exam.  Neurologic: Sensation  intact distally save for the below mentioned MSK exam Psychiatric: Patient is competent for consent with normal mood and affect Lymphatic: No axillary or cervical lymphadenopathy Extremities  LUE: Obvious swelling and tenderness palpation with redness and blanching erythema on the volar aspect of the finger and a large bulla that appears subcutaneous on the dorsal aspect of the long finger.  Patient has pain with passive stretch the finger is held in slight flexion.  Manger the fingers appear relatively normal.  Patient is tender down to the level of the A1 pulley.  + Motor in  AIN, PIN, Ulnar distributions. Sensation intact in medial, radial, and ulnar distributions. Negative tinel at carpal tunnel.  2+ radial pulse with warm and well perfused digits. Compartments soft and compressible.      Test Results Imaging X-rays demonstrate soft tissue swelling over the long finger but no other acute osseous abnormality.  Labs cbc Recent Labs    12/21/17 2050 12/22/17 0230  WBC 13.7* 12.9*  13.1*  HGB 14.6 13.0  13.0  HCT 42.1 38.7*  38.8*  PLT 215 230  222    Labs inflam Recent Labs    12/21/17 2315  CRP 2.6*    Labs coag No results for input(s): INR, PTT in the last 72 hours.  Invalid input(s): PT  Recent Labs    12/21/17 2050 12/22/17 0230  NA 136 140  K 2.8* 3.2*  CL 93* 102  CO2 29 27  GLUCOSE 163* 117*  BUN 11 7  CREATININE 0.95 0.86  CALCIUM 9.0 8.2*     ASSESSMENT AND PLAN: 72 y.o. male with the following: Left long finger swelling and pain concerning for flexor tenosynovitis versus other infectious source  Patient's fingers concerning for flexor tenosynovitis.  Due to the bulla on the dorsum of his finger feel the stat MRI would help [place] with understanding where surgical incisions may need to be placed.  We talked to him at length about the risk benefits and alternatives to surgery and he understands specific risks of continued infection, amputation, damage to  surrounding neurovascular structures and postop stiffness.  He understands a dressing change will be required daily postop.  We confirmed the patient that he was comfortable obtaining consent English and that he was understanding of all of our plan of care.  Stat MRI ordered, will post patient for surgery later this afternoon.

## 2017-12-22 NOTE — Progress Notes (Signed)
Patient arrived from PACU in NAD. VS stable and patient free from pain.  

## 2017-12-22 NOTE — H&P (Signed)
History and Physical   [facility] [place] [facility] [place] Ad[facility] History and Physical [employer], D.O.    Patient Name: Scott Cruz MR#: [id number] Date of Birth: 01/16/1946 Date of Admission: 12/21/2017  Referring MD/NP/PA: Dr. Tomi Bamberger Primary Care Physician: Gaynelle Arabian, MD  Chief Complaint:  Chief Complaint  Patient presents with  . Cellulitis    HPI: Scott Cruz is a 72 y.o. male with a known history of HTN, HLD, prostate cancer presents to the emergency department for evaluation of finger swelling.  Patient was in a usual state of health until yesterday morning when he describes itching of the dorsal aspect of his left third digit at the PIP with subsequent redness, swelling and pain which now extends up into the arm. He reports associated chills.  He denies any wounds, trauma.  Patient denies fevers, weakness, dizziness, chest pain, shortness of breath, N/V/C/D, abdominal pain, dysuria/frequency, changes in mental status.    Otherwise there has been no change in status. Patient has been taking medication as prescribed and there has been no recent change in medication or diet.  No recent antibiotics.  There has been no recent illness, hospitalizations, travel or sick contacts.    EMS/ED Course: Patient received Ancef. Medical admission has been requested for further management of cellulitis / lymphangitis of the left middle finger.  Review of Systems:  CONSTITUTIONAL: No fever/chills, fatigue, weakness, weight gain/loss, headache. EYES: No blurry or double vision. ENT: No tinnitus, postnasal drip, redness or soreness of the oropharynx. RESPIRATORY: No cough, dyspnea, wheeze.  No hemoptysis.  CARDIOVASCULAR: No chest pain, palpitations, syncope, orthopnea. No lower extremity edema.  GASTROINTESTINAL: No nausea, vomiting, abdominal pain, diarrhea, constipation.  No hematemesis, melena or hematochezia. GENITOURINARY: No dysuria, frequency, hematuria. ENDOCRINE:  No polyuria or nocturia. No heat or cold intolerance. HEMATOLOGY: No anemia, bruising, bleeding. INTEGUMENTARY: No rashes, ulcers, lesions. MUSCULOSKELETAL: Positive redness, pain and swelling of the left middle finger as described above. No arthritis, gout, dyspnea. NEUROLOGIC: No numbness, tingling, ataxia, seizure-type activity, weakness. PSYCHIATRIC: No anxiety, depression, insomnia.   Past Medical History:  Diagnosis Date  . Hyperlipidemia   . Hypertension   . Prostate CA [facility])     Past Surgical History:  Procedure Laterality Date  . NO PAST SURGERIES    Surgical history includes low back surgery   reports that he has been smoking cigarettes.  He has a 50.00 pack-year smoking history. He has never used smokeless tobacco. He reports that he does not drink alcohol or use drugs.  No Known Allergies  History reviewed. No pertinent family history.  Prior to Admission medications   Medication Sig Start Date End Date Taking? Authorizing Provider  aspirin EC 81 MG tablet Take 81 mg by mouth daily.   Yes [provider]  hydrochlorothiazide (HYDRODIURIL) 25 MG tablet Take 25 mg by mouth daily.   Yes [provider]  simvastatin (ZOCOR) 20 MG tablet Take 20 mg by mouth every evening.   Yes [provider]  vitamin B-12 (CYANOCOBALAMIN) 1000 MCG tablet Take 1,000 mcg by mouth daily.   Yes [provider]    Physical Exam: Vitals:   12/21/17 2315 12/21/17 2330 12/21/17 2345 12/22/17 0000  BP: 132/75 140/71 136/62 138/71  Pulse: 72 71 70 69  Resp:      Temp:      TempSrc:      SpO2: 95% 96% 98% 98%  Weight:      Height:  GENERAL: 72 y.o.-year-old male patient, well-developed, well-nourished lying in the bed in no acute distress.  Pleasant and cooperative.   HEENT: Head atraumatic, normocephalic. Pupils equal. Mucus membranes moist. NECK: Supple, full range of motion. No JVD, no bruit heard. No thyroid enlargement, no tenderness, no  cervical lymphadenopathy. CHEST: Normal breath sounds bilaterally. No wheezing, rales, rhonchi or crackles. No use of accessory muscles of respiration.  No reproducible chest wall tenderness.  CARDIOVASCULAR: S1, S2 normal. No murmurs, rubs, or gallops. Cap refill <2 seconds. Pulses intact distally.  ABDOMEN: Soft, nondistended, nontender. No rebound, guarding, rigidity. Normoactive bowel sounds present in all four quadrants.  EXTREMITIES: there is a large bullous lesion on PIP joint with surrounding erythema and swelling. Streaking redness extends into left forearm. No pedal edema, cyanosis, or clubbing. No calf tenderness or Homan's sign.  NEUROLOGIC: The patient is alert and oriented x 3. Cranial nerves II through XII are grossly intact with no focal sensorimotor deficit. PSYCHIATRIC:  Normal affect, mood, thought content. SKIN: Warm, dry, and intact without obvious rash, lesion, or ulcer.    Labs on Admission:  CBC: Recent Labs  Lab 12/21/17 2050  WBC 13.7*  NEUTROABS 10.1*  HGB 14.6  HCT 42.1  MCV 61.9*  PLT 782   Basic Metabolic Panel: Recent Labs  Lab 12/21/17 2050  NA 136  K 2.8*  CL 93*  CO2 29  GLUCOSE 163*  BUN 11  CREATININE 0.95  CALCIUM 9.0   GFR: Estimated Creatinine Clearance: 58 mL/min (by C-G formula based on SCr of 0.95 mg/dL). Liver Function Tests: Recent Labs  Lab 12/21/17 2050  AST 31  ALT 26  ALKPHOS 42  BILITOT 1.1  PROT 7.5  ALBUMIN 4.3   No results for input(s): LIPASE, AMYLASE in the last 168 hours. No results for input(s): AMMONIA in the last 168 hours. Coagulation Profile: No results for input(s): INR, PROTIME in the last 168 hours. Cardiac Enzymes: No results for input(s): CKTOTAL, CKMB, CKMBINDEX, TROPONINI in the last 168 hours. BNP (last 3 results) No results for input(s): PROBNP in the last 8760 hours. HbA1C: No results for input(s): HGBA1C in the last 72 hours. CBG: No results for input(s): GLUCAP in the last 168  hours. Lipid Profile: No results for input(s): CHOL, HDL, LDLCALC, TRIG, CHOLHDL, LDLDIRECT in the last 72 hours. Thyroid Function Tests: No results for input(s): TSH, T4TOTAL, FREET4, T3FREE, THYROIDAB in the last 72 hours. Anemia Panel: No results for input(s): VITAMINB12, FOLATE, FERRITIN, TIBC, IRON, RETICCTPCT in the last 72 hours. Urine analysis:    Component Value Date/Time   COLORURINE AMBER (A) 10/12/2011 1843   APPEARANCEUR CLEAR 10/12/2011 1843   LABSPEC 1.020 10/12/2011 1843   PHURINE 6.0 10/12/2011 1843   GLUCOSEU NEGATIVE 10/12/2011 1843   HGBUR SMALL (A) 10/12/2011 1843   BILIRUBINUR NEGATIVE 10/12/2011 1843   KETONESUR 40 (A) 10/12/2011 1843   PROTEINUR 100 (A) 10/12/2011 1843   UROBILINOGEN 1.0 10/12/2011 1843   NITRITE NEGATIVE 10/12/2011 1843   LEUKOCYTESUR NEGATIVE 10/12/2011 1843   Sepsis Labs: @LABRCNTIP (procalcitonin:4,lacticidven:4) )No results found for this or any previous visit (from the past 240 hour(s)).   Radiological Exams on Admission: Dg Finger Middle Left  Result Date: 12/21/2017 CLINICAL DATA:  Blistering of the left middle finger. EXAM: LEFT MIDDLE FINGER 2+V COMPARISON:  None. FINDINGS: No acute fracture nor bone destruction. Joint spaces are maintained. Soft tissue swelling over the dorsal and ulnar aspect of the left middle finger is identified at the level of the middle  phalanx. IMPRESSION: Soft tissue swelling of the left middle finger without underlying bone destruction or fracture. Electronically Signed   By: Ashley Royalty M.D.   On: 12/21/2017 23:54    Assessment/Plan  This is a 72 y.o. male with a history of hypertension, hyperlipidemia, prostate cancer now being admitted with:  #. Cellulitis and lymphangitis of the left third digit - Admit to inpatient  - IV antibiotics:  Ancef - IV fluid hydration - Follow up blood culture - Repeat CBC in am.   #. Hypokalemia, mild - Replace orally -check magnesium level  #. History of  hypertension Continue  HCTZ  #. History of hyperlipidemia - Continue Zocor  Admission status: inpatient IV Fluids: Hep-Lock Diet/Nutrition: heart healthy Consults called: none  DVT Px: Lovenox, SCDs and early ambulation. Code Status: Full Code  Disposition Plan: To home in 1-2 days  All the records are reviewed and case discussed with ED provider. Management plans discussed with the patient and/or family who express understanding and agree with plan of care.  Kaiah Hosea D.O. on 12/22/2017 at 12:03 AM  CC: Primary care physician; Gaynelle Arabian, MD   12/22/2017, 12:03 AM

## 2017-12-22 NOTE — Interval H&P Note (Signed)
Discussed case, risks and benefits with patient again.  All questions answered, no change to history. Due to 4-5 hours till MRI could be obtained we talked with the patient and based on clinical exam and the need for OR debridement regardless we recommended going to the OR now as his bullae is increasing in size dorsally.  Ophelia Charter MD

## 2017-12-22 NOTE — Progress Notes (Signed)
Pt resting in bed with family at bedside. Denies needs or pain. NAD.

## 2017-12-23 ENCOUNTER — Encounter (HOSPITAL_COMMUNITY): Payer: Self-pay | Admitting: Orthopaedic Surgery

## 2017-12-23 LAB — BASIC METABOLIC PANEL
Anion gap: 8 (ref 5–15)
BUN: 12 mg/dL (ref 6–20)
CHLORIDE: 108 mmol/L (ref 101–111)
CO2: 23 mmol/L (ref 22–32)
Calcium: 8 mg/dL — ABNORMAL LOW (ref 8.9–10.3)
Creatinine, Ser: 0.97 mg/dL (ref 0.61–1.24)
GFR calc non Af Amer: >60 mL/min (ref >60)
Glucose, Bld: 170 mg/dL — ABNORMAL HIGH (ref 65–99)
POTASSIUM: 3.8 mmol/L (ref 3.5–5.1)
Sodium: 139 mmol/L (ref 135–145)

## 2017-12-23 LAB — CBC
HEMATOCRIT: 35.4 % — AB (ref 39.0–52.0)
HEMOGLOBIN: 12.1 g/dL — AB (ref 13.0–17.0)
MCH: 20.9 pg — ABNORMAL LOW (ref 26.0–34.0)
MCHC: 34.2 g/dL (ref 30.0–36.0)
MCV: 61.2 fL — ABNORMAL LOW (ref 78.0–100.0)
Platelets: 210 10*3/uL (ref 150–400)
RBC: 5.78 MIL/uL (ref 4.22–5.81)
RDW: 16.6 % — ABNORMAL HIGH (ref 11.5–15.5)
WBC: 21.2 10*3/uL — AB (ref 4.0–10.5)

## 2017-12-23 NOTE — Anesthesia Procedure Notes (Signed)
Procedure Name: LMA Insertion Date/Time: 12/23/2017 11:43 AM Performed by: Oletta Lamas, CRNA Pre-anesthesia Checklist: Patient identified, Emergency Drugs available, Suction available and Patient being monitored Patient Re-evaluated:Patient Re-evaluated prior to induction Oxygen Delivery Method: Circle System Utilized Preoxygenation: Pre-oxygenation with 100% oxygen Induction Type: IV induction Ventilation: Mask ventilation without difficulty LMA: LMA inserted LMA Size: 4.0 Number of attempts: 1 Placement Confirmation: positive ETCO2 Tube secured with: Tape Dental Injury: Teeth and Oropharynx as per pre-operative assessment

## 2017-12-23 NOTE — Progress Notes (Signed)
ORTHOPAEDIC PROGRESS NOTE  s/p Procedure(s): IRRIGATION AND DEBRIDEMENT LEFT  LONG FINGER ABSCESS  SUBJECTIVE: Reports mild pain about operative site. No chest pain. No SOB. No nausea/vomiting. No other complaints.  Pain much improved from preop, able to move finger now.    OBJECTIVE: PE: LUE: able to demonstrate [facility] of 2 with finger, sensation intact, cap refill normal.  Patient much improved as far as ROM.  Vitals:   12/23/17 0733 12/23/17 1235  BP: (!) 79/35 (!) 115/58  Pulse: 63 74  Resp: 18 17  Temp: 97.6 F (36.4 C) 97.6 F (36.4 C)  SpO2: 93% 99%     ASSESSMENT: Scott Cruz is a 72 y.o. male doing well postoperatively.  PLAN: Weightbearing: NWB LUE Insicional and dressing care: daily soaks with PT wound care, when tolerating dressing changes ok for dispo from ortho standpoint` Orthopedic device(s): None Showering: prn VTE prophylaxis: per primary Pain control: prn meds Follow - up plan: 1 week for wound check after dispo from hospital Contact information:  Weekdays 8-5 Ophelia Charter MD [phone], After hours and holidays please check [email or web address] for group call information for [facility]

## 2017-12-23 NOTE — Transfer of Care (Signed)
Immediate Anesthesia Transfer of Care Note  Patient: Scott Cruz  Procedure(s) Performed: IRRIGATION AND DEBRIDEMENT LEFT  LONG FINGER ABSCESS (Left Middle Finger)  Patient Location: PACU  Anesthesia Type:General  Level of Consciousness: awake, drowsy and patient cooperative  Airway & Oxygen Therapy: Patient Spontanous Breathing  Post-op Assessment: Report given to RN and Post -op Vital signs reviewed and stable  Post vital signs: Reviewed and stable  Last Vitals:  Vitals Value Taken Time  BP    Temp    Pulse    Resp    SpO2      Last Pain:  Vitals:   12/23/17 0733  TempSrc: Oral  PainSc:       Patients Stated Pain Goal: 4 (77/82/42 3536)  Complications: No apparent anesthesia complications

## 2017-12-23 NOTE — Progress Notes (Signed)
Physical Therapy Wound Evaluation/Treatment Patient Details  Name: Scott Cruz MRN: [id number] Date of Birth: [birth year]  Today's Date: 12/23/2017 Time: 1749-4496 Time Calculation (min): 40 min  Subjective  Subjective: Pt pleasant and agreeable to hydrotherapy. States he has less pain now.  Patient and Family Stated Goals: Heal wound Date of Onset: 12/21/17 Prior Treatments: abscess drainage and tenosynovectomy on 12/22/17  Pain Score:  Pt initially indicated hand was numb but as treatment progressed reports mild pain.   Wound Assessment  Wound / Incision (Open or Dehisced) 12/23/17 Degloving;Incision - Open Finger (Comment which one) Left;Posterior Dorsal aspect of middle fnger (Active)  Wound Image   12/23/2017  9:34 AM  Dressing Type Gauze (Comment);Moist to dry;Non adherent 12/23/2017  9:34 AM  Dressing Changed Changed 12/23/2017  9:34 AM  Dressing Status Clean;Dry;Intact 12/23/2017  9:34 AM  Dressing Change Frequency Daily 12/23/2017  9:34 AM  Site / Wound Assessment Red;Yellow 12/23/2017  9:34 AM  % Wound base Red or Granulating 90% 12/23/2017  9:34 AM  % Wound base Yellow/Fibrinous Exudate 10% 12/23/2017  9:34 AM  % Wound base Black/Eschar 0% 12/23/2017  9:34 AM  % Wound base Other/Granulation Tissue (Comment) 0% 12/23/2017  9:34 AM  Peri-wound Assessment Intact 12/23/2017  9:34 AM  Wound Length (cm) 5.2 cm 12/23/2017  9:34 AM  Wound Width (cm) 3.8 cm 12/23/2017  9:34 AM  Wound Depth (cm) 0.1 cm 12/23/2017  9:34 AM  Wound Volume (cm^3) 1.98 cm^3 12/23/2017  9:34 AM  Wound Surface Area (cm^2) 19.76 cm^2 12/23/2017  9:34 AM  Tunneling (cm) 0 12/23/2017  9:34 AM  Undermining (cm) 0 12/23/2017  9:34 AM  Margins Unattached edges (unapproximated) 12/23/2017  9:34 AM  Closure None 12/23/2017  9:34 AM  Drainage Amount Minimal 12/23/2017  9:34 AM  Drainage Description Serosanguineous 12/23/2017  9:34 AM  Treatment Hydrotherapy (Pulse lavage) 12/23/2017  9:34 AM      Hydrotherapy Pulsed lavage therapy -  wound location: Dorsal aspect of L middle finger Pulsed Lavage with Suction (psi): 4 psi(4-8) Pulsed Lavage with Suction - Normal Saline Used: 1000 mL Pulsed Lavage Tip: Tip with splash shield   Wound Assessment and Plan  Wound Therapy - Assess/Plan/Recommendations Wound Therapy - Clinical Statement: Pt presents with L flexor tenosynovitis of the middle finger and is now s/p abscess drainage. Pt will benefit from continued hydrotherapy to decrease bioburden and promote wound bed healing.  Wound Therapy - Functional Problem List: Decreased AROM and acute pain Factors Delaying/Impairing Wound Healing: Infection - systemic/local Hydrotherapy Plan: Debridement;Dressing change;Patient/family education;Pulsatile lavage with suction Wound Therapy - Frequency: 6X / week Wound Therapy - Follow Up Recommendations: Home health RN Wound Plan: See above  Wound Therapy Goals- Improve the function of patient's integumentary system by progressing the wound(s) through the phases of wound healing (inflammation - proliferation - remodeling) by: Decrease Necrotic Tissue to: 0% Decrease Necrotic Tissue - Progress: Goal set today Increase Granulation Tissue to: 100% Increase Granulation Tissue - Progress: Goal set today Goals/treatment plan/discharge plan were made with and agreed upon by patient/family: Yes Time For Goal Achievement: 7 days Wound Therapy - Potential for Goals: Excellent  Goals will be updated until maximal potential achieved or discharge criteria met.  Discharge criteria: when goals achieved, discharge from hospital, MD decision/surgical intervention, no progress towards goals, refusal/missing three consecutive treatments without notification or medical reason.  GP     Thelma Comp 12/23/2017, 9:45 AM   Rolinda Roan, PT, DPT Acute Rehabilitation Services Pager: [phone]

## 2017-12-23 NOTE — Evaluation (Signed)
Occupational Therapy Evaluation Patient Details Name: Scott Cruz MRN: [id number] DOB: 05/15/1946 Today's Date: 12/23/2017    History of Present Illness Pt is a 72 y.o. male admitted 12/21/17 with finger swelling; worked up for cellulitis and lymphangitis of the left third digit. PMH includes HTN, HLD, prostate CA.  Pt underwent dorsal superficial debridement of L middle finger flexor tenosynovitis with follow up ROM ordered as well as wound care from PT.   Clinical Impression   Pt admitted with the above diagnosis and has the deficits listed below. Pt would benefit from 1-2 more sessions of OT to check in and make sure pt is independent doing basic ROM to L fingers (middle), wrist and elbow in order to increase functional use of this hand s/p debridement.  Pt was encouraged to use this hand as much as possible during adls to increase functional ROM as well.  ROM is much better at this point than it was before surgery. Will cont to follow to check in and assess functional use.     Follow Up Recommendations  Follow surgeon's recommendation for DC plan and follow-up therapies    Equipment Recommendations  None recommended by OT    Recommendations for Other Services       Precautions / Restrictions Restrictions Weight Bearing Restrictions: No Other Position/Activity Restrictions: instructed pt to keep L arm elevated about heart to reduce swelling.      Mobility Bed Mobility Overal bed mobility: Independent                Transfers Overall transfer level: Independent               General transfer comment: No assist needd    Balance Overall balance assessment: Needs assistance   Sitting balance-Leahy Scale: Normal       Standing balance-Leahy Scale: Good                             ADL either performed or assessed with clinical judgement   ADL Overall ADL's : Modified independent                                       General ADL  Comments: Pt able to do most adls because his right handed and has learned to adapt with L hand in dressing.  Pt can feed self, groom, toilet with pants that do not have to fasten and dress.  PT redressed L hand leaving other digits free so pt can more and use them as able.  Encouraged pt to continue with basic ROM of all fingers, wrist, elbow and shoulder 3 times a day and encouraged pt to use that hand for functional tasks when able.     Vision Baseline Vision/History: No visual deficits Patient Visual Report: No change from baseline Vision Assessment?: No apparent visual deficits     Perception Perception Perception Tested?: No   Praxis Praxis Praxis tested?: Within functional limits    Pertinent Vitals/Pain Pain Assessment: 0-10 Pain Score: 6  Pain Location: L middle finger Pain Descriptors / Indicators: Sore;Burning Pain Intervention(s): Limited activity within patient's tolerance;Monitored during session;Repositioned     Hand Dominance Right   Extremity/Trunk Assessment Upper Extremity Assessment Upper Extremity Assessment: LUE deficits/detail LUE Deficits / Details: L middle finger MCP ROM 40 degrees flexion/full extension.  PIP 100 degrees flexion/full extension/ DIP 10 degrees  flexion/full extension.  Pt wrist, elbow/shoulder all WNL and strength WFL throughout.   LUE Sensation: decreased light touch(pt reports continued numbness of L middle finger) LUE Coordination: decreased fine motor   Lower Extremity Assessment Lower Extremity Assessment: Overall WFL for tasks assessed   Cervical / Trunk Assessment Cervical / Trunk Assessment: Normal   Communication Communication Communication: Prefers language other than [place];Other (comment)(but understands at least 75% of basic commands)   Cognition Arousal/Alertness: Awake/alert Behavior During Therapy: WFL for tasks assessed/performed Overall Cognitive Status: Within Functional Limits for tasks assessed                                      General Comments  Pt degloving look to L middle finger due to superficial debridement.  Mostly meaty red but some white noted in dorsal finger.  See PT notes from wound care.    Exercises Exercises: Hand exercises Hand Exercises Wrist Flexion: AROM;10 reps;Left Wrist Extension: AROM;10 reps;Left Digit Composite Flexion: AROM;Left;10 reps Composite Extension: AROM;Left;10 reps Thumb Abduction: AROM;Left;10 reps Thumb Adduction: AROM;Left;10 reps Opposition: AROM;Left;10 reps   Shoulder Instructions      Home Living Family/patient expects to be discharged to:: Private residence Living Arrangements: Spouse/significant other;Other relatives Available Help at Discharge: Family;Available 24 hours/day Type of Home: House Home Access: Level entry     Home Layout: Able to live on main level with bedroom/bathroom         Bathroom Toilet: Standard     Home Equipment: None          Prior Functioning/Environment Level of Independence: Independent        Comments: Retired from work (worked housekeeping at [employer]). Drives        OT Problem List: Decreased strength;Decreased range of motion;Pain;Impaired UE functional use      OT Treatment/Interventions: Therapeutic exercise    OT Goals(Current goals can be found in the care plan section) Acute Rehab OT Goals Patient Stated Goal: to move L hand well OT Goal Formulation: With patient Time For Goal Achievement: 12/30/17 Potential to Achieve Goals: Good ADL Goals Additional ADL Goal #1: Pt will keep L arm elevated above heart at all times in bed as shown Ily to reduce swelling and increase use of LUE. Additional ADL Goal #2: Pt will be independent with basic AROM of fingers, wrist, elbow and shoulder and use fuctionally during adls Ily to improve ROM/use of this hand.  OT Frequency: Min 2X/week   Barriers to D/C:            Co-evaluation              AM-PAC PT "6 Clicks" Daily  Activity     Outcome Measure Help from another person eating meals?: None Help from another person taking care of personal grooming?: None Help from another person toileting, which includes using toliet, bedpan, or urinal?: A Little Help from another person bathing (including washing, rinsing, drying)?: A Little Help from another person to put on and taking off regular upper body clothing?: None Help from another person to put on and taking off regular lower body clothing?: None 6 Click Score: 22   End of Session Nurse Communication: Mobility status;Patient requests pain meds  Activity Tolerance: Patient limited by pain Patient left: in bed;with call bell/phone within reach;Other (comment)(PT finishing dressing to L hand)  OT Visit Diagnosis: Muscle weakness (generalized) (M62.81)  Time: 1694-5038 OT Time Calculation (min): 10 min Charges:  OT General Charges $OT Visit: 1 Visit OT Evaluation $OT Eval Low Complexity: 1 Low G-Codes:     Jinger Neighbors, OTR/L [phone]  Glenford Peers 12/23/2017, 9:31 AM

## 2017-12-23 NOTE — Progress Notes (Signed)
PROGRESS NOTE    Scott Cruz  OAC:[id number] DOB: 1946-05-28 DOA: 12/21/2017 PCP: Gaynelle Arabian, MD    Brief Narrative:Scott Cruz is a 72 y.o. male with a known history of HTN, HLD, prostate cancer presents to the emergency department for evaluation of finger swelling.  Patient was in a usual state of health until yesterday morning when he describes itching of the dorsal aspect of his left third digit at the PIP with subsequent redness, swelling and pain which now extends up into the arm. He reports associated chills.  He denies any wounds, trauma.  Patient denies fevers, weakness, dizziness, chest pain, shortness of breath, N/V/C/D, abdominal pain, dysuria/frequency, changes in mental status.    Otherwise there has been no change in status. Patient has been taking medication as prescribed and there has been no recent change in medication or diet.  No recent antibiotics.  There has been no recent illness, hospitalizations, travel or sick contacts.    EMS/ED Course: Patient received Ancef. Medical admission has been requested for further management of cellulitis / lymphangitis of the left middle finger.    Assessment & Plan:   Active Problems:   Cellulitis of left middle finger   1-Left middle finger infection, left hand infection.  Bullae increase in size 5-12 , edema and redness left hand dorsal.  Orhto consulted. Underwent abscess drainage of finger and Flexor tenosynovectomy.  Continue with ancef and vancomycin.  Wound culture growing rare gram positive cocci in pairs and single.  Continue with IV antibiotics.   2-Hypokalemia; Resolved.   3-HTN; hold HCTZ to avoid hypotension.   4-HLD; Continue with lipitor.   DVT prophylaxis: lovenox Code Status: full code.  Family Communication:  Son in Sports coach at bedside.  Disposition Plan: home when stable.    Consultants:   Ortho   Procedures:  Sx flexor tenosynovectomy.  Abscess drainage.   Antimicrobials: ancef 5-11 Vancomycin  5-11  Subjective: Pain is better, needs to go to bathroom   Objective: Vitals:   12/22/17 2356 12/23/17 0419 12/23/17 0733 12/23/17 1235  BP: (!) 96/53 (!) 101/56 (!) 79/35 (!) 115/58  Pulse: (!) 59 (!) 58 63 74  Resp: 19 18 18 17   Temp: (!) 97.4 F (36.3 C) (!) 97.3 F (36.3 C) 97.6 F (36.4 C) 97.6 F (36.4 C)  TempSrc: Oral Oral Oral Oral  SpO2: 99% 95% 93% 99%  Weight:      Height:        Intake/Output Summary (Last 24 hours) at 12/23/2017 1530 Last data filed at 12/23/2017 0630 Gross per 24 hour  Intake 2213.33 ml  Output 650 ml  Net 1563.33 ml   Filed Weights   12/21/17 2033  Weight: 65.8 kg (145 lb)    Examination:  General exam: NAD Respiratory system: CTA Cardiovascular system: S 1, S 2 RRR Gastrointestinal system: BS present, soft, nt Central nervous system: non focal.  Extremities: Symmetric 5 x 5 power. Skin: left hand with dressing.     Data Reviewed: I have personally reviewed following labs and imaging studies  CBC: Recent Labs  Lab 12/21/17 2050 12/22/17 0230 12/23/17 0747  WBC 13.7* 12.9*  13.1* 21.2*  NEUTROABS 10.1*  --   --   HGB 14.6 13.0  13.0 12.1*  HCT 42.1 38.7*  38.8* 35.4*  MCV 61.9* 61.7*  61.7* 61.2*  PLT 215 230  222 010   Basic Metabolic Panel: Recent Labs  Lab 12/21/17 2050 12/22/17 0230 12/23/17 0747  NA 136 140 139  K  2.8* 3.2* 3.8  CL 93* 102 108  CO2 29 27 23   GLUCOSE 163* 117* 170*  BUN 11 7 12   CREATININE 0.95 0.86 0.97  CALCIUM 9.0 8.2* 8.0*  MG  --  1.7  --   PHOS  --  2.8  --    GFR: Estimated Creatinine Clearance: 56.8 mL/min (by C-G formula based on SCr of 0.97 mg/dL). Liver Function Tests: Recent Labs  Lab 12/21/17 2050  AST 31  ALT 26  ALKPHOS 42  BILITOT 1.1  PROT 7.5  ALBUMIN 4.3   No results for input(s): LIPASE, AMYLASE in the last 168 hours. No results for input(s): AMMONIA in the last 168 hours. Coagulation Profile: No results for input(s): INR, PROTIME in the last 168  hours. Cardiac Enzymes: No results for input(s): CKTOTAL, CKMB, CKMBINDEX, TROPONINI in the last 168 hours. BNP (last 3 results) No results for input(s): PROBNP in the last 8760 hours. HbA1C: No results for input(s): HGBA1C in the last 72 hours. CBG: No results for input(s): GLUCAP in the last 168 hours. Lipid Profile: No results for input(s): CHOL, HDL, LDLCALC, TRIG, CHOLHDL, LDLDIRECT in the last 72 hours. Thyroid Function Tests: No results for input(s): TSH, T4TOTAL, FREET4, T3FREE, THYROIDAB in the last 72 hours. Anemia Panel: No results for input(s): VITAMINB12, FOLATE, FERRITIN, TIBC, IRON, RETICCTPCT in the last 72 hours. Sepsis Labs: Recent Labs  Lab 12/21/17 2059 12/21/17 2313  LATICACIDVEN 2.47* 1.52    Recent Results (from the past 240 hour(s))  Surgical PCR screen     Status: None   Collection Time: 12/22/17 10:48 AM  Result Value Ref Range Status   MRSA, PCR NEGATIVE NEGATIVE Final   Staphylococcus aureus NEGATIVE NEGATIVE Final    Comment: (NOTE) The Xpert SA Assay (FDA approved for NASAL specimens in patients 47 years of age and older), is one component of a comprehensive surveillance program. It is not intended to diagnose infection nor to guide or monitor treatment. Performed at [facility], [place] [address] [place], [place] [address]   Aerobic/Anaerobic Culture (surgical/deep wound)     Status: None (Preliminary result)   Collection Time: 12/22/17 12:03 PM  Result Value Ref Range Status   Specimen Description ABSCESS  Final   Special Requests LEFT HAND  Final   Gram Stain   Final    ABUNDANT WBC PRESENT,BOTH PMN AND MONONUCLEAR MODERATE GRAM POSITIVE COCCI IN PAIRS IN CLUSTERS    Culture   Final    MODERATE STAPHYLOCOCCUS AUREUS SUSCEPTIBILITIES TO FOLLOW Performed at [facility], [place] [address], [place], [place] [address]    Report Status PENDING  Incomplete  Aerobic/Anaerobic Culture (surgical/deep wound)     Status: None  (Preliminary result)   Collection Time: 12/22/17 12:10 PM  Result Value Ref Range Status   Specimen Description FLUID  Final   Special Requests LEFT FLEXOR TENOSYNOVITIS  Final   Gram Stain   Final    RARE WBC PRESENT, PREDOMINANTLY PMN RARE GRAM POSITIVE COCCI IN PAIRS IN SINGLES    Culture   Final    CULTURE REINCUBATED FOR BETTER GROWTH Performed at [facility], [place] [address] [place], [place] [address]    Report Status PENDING  Incomplete         Radiology Studies: Dg Finger Middle Left  Result Date: 12/21/2017 CLINICAL DATA:  Blistering of the left middle finger. EXAM: LEFT MIDDLE FINGER 2+V COMPARISON:  None. FINDINGS: No acute fracture nor bone destruction. Joint spaces  are maintained. Soft tissue swelling over the dorsal and ulnar aspect of the left middle finger is identified at the level of the middle phalanx. IMPRESSION: Soft tissue swelling of the left middle finger without underlying bone destruction or fracture. Electronically Signed   By: Ashley Royalty M.D.   On: 12/21/2017 23:54        Scheduled Meds: . aspirin EC  81 mg Oral Daily  . enoxaparin (LOVENOX) injection  40 mg Subcutaneous Q24H  . mupirocin ointment  1 application Nasal BID  . simvastatin  20 mg Oral QPM  . vitamin B-12  1,000 mcg Oral Daily   Continuous Infusions: . sodium chloride 100 mL/hr at 12/23/17 0231  .  ceFAZolin (ANCEF) IV Stopped (12/23/17 0700)  . vancomycin Stopped (12/23/17 1316)     LOS: 1 day    Time spent: 35 minutes     Elmarie Shiley, MD [facility] Pager [phone]  If 7PM-7AM, please contact night-coverage [email or web address] Password [email or web address] 12/23/2017, 3:30 PM

## 2017-12-24 LAB — CBC
HEMATOCRIT: 33.8 % — AB (ref 39.0–52.0)
Hemoglobin: 11.6 g/dL — ABNORMAL LOW (ref 13.0–17.0)
MCH: 21.1 pg — ABNORMAL LOW (ref 26.0–34.0)
MCHC: 34.3 g/dL (ref 30.0–36.0)
MCV: 61.3 fL — ABNORMAL LOW (ref 78.0–100.0)
Platelets: 218 10*3/uL (ref 150–400)
RBC: 5.51 MIL/uL (ref 4.22–5.81)
RDW: 16.9 % — AB (ref 11.5–15.5)
WBC: 15.9 10*3/uL — ABNORMAL HIGH (ref 4.0–10.5)

## 2017-12-24 MED ORDER — OXYCODONE HCL 5 MG PO TABS: 5.0000 mg | ORAL_TABLET | Freq: Four times a day (QID) | ORAL | 0 refills | Status: DC | PRN

## 2017-12-24 MED ORDER — SULFAMETHOXAZOLE-TRIMETHOPRIM 800-160 MG PO TABS: 1.0000 | ORAL_TABLET | Freq: Two times a day (BID) | ORAL | 0 refills | Status: DC

## 2017-12-24 MED ORDER — SENNOSIDES-DOCUSATE SODIUM 8.6-50 MG PO TABS: 1.0000 | ORAL_TABLET | Freq: Every day | ORAL | 0 refills | Status: DC

## 2017-12-24 NOTE — Progress Notes (Signed)
Discharge instruction reviewed with patient. Wound care instruction reviewed with handout given. All questions answered at this time. RXs given to patient. Transport home by family.    Ave Filter, RN

## 2017-12-24 NOTE — Discharge Summary (Signed)
Physician Discharge Summary  Scott Cruz LZJ:[id number] DOB: [birth year] DOA: 12/21/2017  PCP: Scott Arabian, MD  Admit date: 12/21/2017 Discharge date: 12/24/2017  Admitted From: Home  Disposition:  Home   Recommendations for Outpatient Follow-up:  1. Follow up with PCP in 1-2 weeks 2. Please obtain BMP/CBC in one week 3. Needs follow up on middle finger wound.   Home Health: yes, HH, nurse.   Discharge Condition: stable.  CODE STATUS: full code/  Diet recommendation: Heart Healthy   Brief/Interim Summary:  Discharge Diagnoses:  Active Problems:   Cellulitis of left middle finger   Brief Narrative:[name] a 72 y.o.malewith a known history of HTN, HLD, prostate cancerpresents to the emergency department for evaluation of finger swelling. Patient was in a usual state of health until yesterday morning when he describes itching of thedorsal aspect of his left third digit at the PIP withsubsequent redness, swelling and pain which now extends up into the arm. He reports associated chills.He denies any wounds, trauma.  Patient denies fevers,weakness, dizziness, chest pain, shortness of breath, N/V/C/D, abdominal pain, dysuria/frequency, changes in mental status.   Otherwise there has been no change in status. Patient has been taking medication as prescribed and there has been no recent change in medication or diet. No recent antibiotics. There has been no recent illness, hospitalizations, travel or sick contacts.   EMS/ED Course:Patient received Ancef. Medical admission has been requested for further management ofcellulitis / lymphangitis of the left middle finger.    Assessment & Plan:   Active Problems:   Cellulitis of left middle finger   1-Left middle finger infection, left hand infection.  Bullae increase in size 5-12 , edema and redness left hand dorsal.  Orhto consulted. Underwent abscess drainage of finger and Flexor tenosynovectomy.  Continue  with ancef and vancomycin.  Wound culture grew MRSA sensitive to clindamycin and bactrim.  Treated with IV ancef and Vancomycin while in the hospital. He will be discharge on Bactrim for  7 days.  He will need to follow up with Dr Griffin Basil with orthopedic.  Discussed with Dr Griffin Basil, ok to discharge today if culture available. He will write wound care instruction.   2-Hypokalemia; Resolved.   3-HTN; hold HCTZ to avoid hypotension.   4-HLD; Continue with lipitor.      Discharge Instructions  Discharge Instructions    Diet - low sodium heart healthy   Complete by:  As directed    Increase activity slowly   Complete by:  As directed      Allergies as of 12/24/2017   No Known Allergies     Medication List    STOP taking these medications   hydrochlorothiazide 25 MG tablet Commonly known as:  HYDRODIURIL     TAKE these medications   aspirin EC 81 MG tablet Take 81 mg by mouth daily.   oxyCODONE 5 MG immediate release tablet Commonly known as:  Oxy IR/ROXICODONE Take 1 tablet (5 mg total) by mouth every 6 (six) hours as needed for moderate pain.   senna-docusate 8.6-50 MG tablet Commonly known as:  Senokot-S Take 1 tablet by mouth at bedtime.   simvastatin 20 MG tablet Commonly known as:  ZOCOR Take 20 mg by mouth every evening.   sulfamethoxazole-trimethoprim 800-160 MG tablet Commonly known as:  BACTRIM DS,SEPTRA DS Take 1 tablet by mouth 2 (two) times daily.   vitamin B-12 1000 MCG tablet Commonly known as:  CYANOCOBALAMIN Take 1,000 mcg by mouth daily.      Follow-up Information  Scott Arabian, MD Follow up in 1 week(s).   Specialty:  Family Medicine Contact information: [address] [place] [address] [phone]        Scott Gash, MD Follow up in 1 week(s).   Specialty:  Orthopedic Surgery Contact information: [address] [place] [address] [phone]          No Known  Allergies  Consultations:  Ortho    Procedures/Studies: Dg Finger Middle Left  Result Date: 12/21/2017 CLINICAL DATA:  Blistering of the left middle finger. EXAM: LEFT MIDDLE FINGER 2+V COMPARISON:  None. FINDINGS: No acute fracture nor bone destruction. Joint spaces are maintained. Soft tissue swelling over the dorsal and ulnar aspect of the left middle finger is identified at the level of the middle phalanx. IMPRESSION: Soft tissue swelling of the left middle finger without underlying bone destruction or fracture. Electronically Signed   By: Ashley Royalty M.D.   On: 12/21/2017 23:54       Subjective: He is doing well. Left hand pain controlled.   Discharge Exam: Vitals:   12/24/17 0744 12/24/17 1229  BP: 117/68 (!) 122/58  Pulse: (!) 57 (!) 52  Resp: 18 16  Temp: 97.8 F (36.6 C) 98 F (36.7 C)  SpO2: 97% 97%   Vitals:   12/23/17 2349 12/24/17 0432 12/24/17 0744 12/24/17 1229  BP: 107/61 114/65 117/68 (!) 122/58  Pulse: (!) 55 (!) 54 (!) 57 (!) 52  Resp: 18 18 18 16   Temp: 97.8 F (36.6 C) 97.8 F (36.6 C) 97.8 F (36.6 C) 98 F (36.7 C)  TempSrc: Oral Oral Oral Oral  SpO2: 97% 96% 97% 97%  Weight:      Height:        General: Pt is alert, awake, not in acute distress Cardiovascular: RRR, S1/S2 +, no rubs, no gallops Respiratory: CTA bilaterally, no wheezing, no rhonchi Abdominal: Soft, NT, ND, bowel sounds + Extremities: no edema, no cyanosis. Left hand with dressing     The results of significant diagnostics from this hospitalization (including imaging, microbiology, ancillary and laboratory) are listed below for reference.     Microbiology: Recent Results (from the past 240 hour(s))  Surgical PCR screen     Status: None   Collection Time: 12/22/17 10:48 AM  Result Value Ref Range Status   MRSA, PCR NEGATIVE NEGATIVE Final   Staphylococcus aureus NEGATIVE NEGATIVE Final    Comment: (NOTE) The Xpert SA Assay (FDA approved for NASAL specimens in  patients 74 years of age and older), is one component of a comprehensive surveillance program. It is not intended to diagnose infection nor to guide or monitor treatment. Performed at [facility], [place] [address], [place], [place] [address]   Aerobic/Anaerobic Culture (surgical/deep wound)     Status: None (Preliminary result)   Collection Time: 12/22/17 12:03 PM  Result Value Ref Range Status   Specimen Description ABSCESS  Final   Special Requests LEFT HAND  Final   Gram Stain   Final    ABUNDANT WBC PRESENT,BOTH PMN AND MONONUCLEAR MODERATE GRAM POSITIVE COCCI IN PAIRS IN CLUSTERS Performed at [facility], [address] [place] , [place] [address]    Culture   Final    MODERATE METHICILLIN RESISTANT STAPHYLOCOCCUS AUREUS NO ANAEROBES ISOLATED; CULTURE IN PROGRESS FOR 5 DAYS    Report Status PENDING  Incomplete   Organism ID, Bacteria METHICILLIN RESISTANT STAPHYLOCOCCUS AUREUS  Final  Susceptibility   Methicillin resistant staphylococcus aureus - MIC*    CIPROFLOXACIN <=0.5 SENSITIVE Sensitive     ERYTHROMYCIN >=8 RESISTANT Resistant     GENTAMICIN <=0.5 SENSITIVE Sensitive     OXACILLIN >=4 RESISTANT Resistant     TETRACYCLINE >=16 RESISTANT Resistant     VANCOMYCIN <=0.5 SENSITIVE Sensitive     TRIMETH/SULFA <=10 SENSITIVE Sensitive     CLINDAMYCIN >=8 RESISTANT Resistant     RIFAMPIN <=0.5 SENSITIVE Sensitive     Inducible Clindamycin NEGATIVE Sensitive     * MODERATE METHICILLIN RESISTANT STAPHYLOCOCCUS AUREUS  Aerobic/Anaerobic Culture (surgical/deep wound)     Status: None (Preliminary result)   Collection Time: 12/22/17 12:10 PM  Result Value Ref Range Status   Specimen Description FLUID  Final   Special Requests LEFT FLEXOR TENOSYNOVITIS  Final   Gram Stain   Final    RARE WBC PRESENT, PREDOMINANTLY PMN RARE GRAM POSITIVE COCCI IN PAIRS IN SINGLES Performed at [facility], [place] [address], [place], [place] [address]    Culture   Final     RARE UNIDENTIFIED ORGANISM IDENTIFICATION AND SUSCEPTIBILITIES TO FOLLOW NO ANAEROBES ISOLATED; CULTURE IN PROGRESS FOR 5 DAYS    Report Status PENDING  Incomplete     Labs: BNP (last 3 results) No results for input(s): BNP in the last 8760 hours. Basic Metabolic Panel: Recent Labs  Lab 12/21/17 2050 12/22/17 0230 12/23/17 0747  NA 136 140 139  K 2.8* 3.2* 3.8  CL 93* 102 108  CO2 29 27 23   GLUCOSE 163* 117* 170*  BUN 11 7 12   CREATININE 0.95 0.86 0.97  CALCIUM 9.0 8.2* 8.0*  MG  --  1.7  --   PHOS  --  2.8  --    Liver Function Tests: Recent Labs  Lab 12/21/17 2050  AST 31  ALT 26  ALKPHOS 42  BILITOT 1.1  PROT 7.5  ALBUMIN 4.3   No results for input(s): LIPASE, AMYLASE in the last 168 hours. No results for input(s): AMMONIA in the last 168 hours. CBC: Recent Labs  Lab 12/21/17 2050 12/22/17 0230 12/23/17 0747 12/24/17 0737  WBC 13.7* 12.9*  13.1* 21.2* 15.9*  NEUTROABS 10.1*  --   --   --   HGB 14.6 13.0  13.0 12.1* 11.6*  HCT 42.1 38.7*  38.8* 35.4* 33.8*  MCV 61.9* 61.7*  61.7* 61.2* 61.3*  PLT 215 230  222 210 218   Cardiac Enzymes: No results for input(s): CKTOTAL, CKMB, CKMBINDEX, TROPONINI in the last 168 hours. BNP: Invalid input(s): POCBNP CBG: No results for input(s): GLUCAP in the last 168 hours. D-Dimer No results for input(s): DDIMER in the last 72 hours. Hgb A1c No results for input(s): HGBA1C in the last 72 hours. Lipid Profile No results for input(s): CHOL, HDL, LDLCALC, TRIG, CHOLHDL, LDLDIRECT in the last 72 hours. Thyroid function studies No results for input(s): TSH, T4TOTAL, T3FREE, THYROIDAB in the last 72 hours.  Invalid input(s): FREET3 Anemia work up No results for input(s): VITAMINB12, FOLATE, FERRITIN, TIBC, IRON, RETICCTPCT in the last 72 hours. Urinalysis    Component Value Date/Time   COLORURINE AMBER (A) 10/12/2011 1843   APPEARANCEUR CLEAR 10/12/2011 1843   LABSPEC 1.020 10/12/2011 1843   PHURINE  6.0 10/12/2011 1843   GLUCOSEU NEGATIVE 10/12/2011 1843   HGBUR SMALL (A) 10/12/2011 1843   BILIRUBINUR NEGATIVE 10/12/2011 1843   KETONESUR 40 (A) 10/12/2011 1843   PROTEINUR 100 (A) 10/12/2011 1843   UROBILINOGEN 1.0 10/12/2011  [place] 10/12/2011 1843   LEUKOCYTESUR NEGATIVE 10/12/2011 1843   Sepsis Labs Invalid input(s): PROCALCITONIN,  WBC,  LACTICIDVEN Microbiology Recent Results (from the past 240 hour(s))  Surgical PCR screen     Status: None   Collection Time: 12/22/17 10:48 AM  Result Value Ref Range Status   MRSA, PCR NEGATIVE NEGATIVE Final   Staphylococcus aureus NEGATIVE NEGATIVE Final    Comment: (NOTE) The Xpert SA Assay (FDA approved for NASAL specimens in patients 24 years of age and older), is one component of a comprehensive surveillance program. It is not intended to diagnose infection nor to guide or monitor treatment. Performed at [facility], [place] [address], [place], [place] [address]   Aerobic/Anaerobic Culture (surgical/deep wound)     Status: None (Preliminary result)   Collection Time: 12/22/17 12:03 PM  Result Value Ref Range Status   Specimen Description ABSCESS  Final   Special Requests LEFT HAND  Final   Gram Stain   Final    ABUNDANT WBC PRESENT,BOTH PMN AND MONONUCLEAR MODERATE GRAM POSITIVE COCCI IN PAIRS IN CLUSTERS Performed at [facility], [address], [place], [place] [address]    Culture   Final    MODERATE METHICILLIN RESISTANT STAPHYLOCOCCUS AUREUS NO ANAEROBES ISOLATED; CULTURE IN PROGRESS FOR 5 DAYS    Report Status PENDING  Incomplete   Organism ID, Bacteria METHICILLIN RESISTANT STAPHYLOCOCCUS AUREUS  Final      Susceptibility   Methicillin resistant staphylococcus aureus - MIC*    CIPROFLOXACIN <=0.5 SENSITIVE Sensitive     ERYTHROMYCIN >=8 RESISTANT Resistant     GENTAMICIN <=0.5 SENSITIVE Sensitive     OXACILLIN >=4 RESISTANT Resistant     TETRACYCLINE >=16 RESISTANT Resistant      VANCOMYCIN <=0.5 SENSITIVE Sensitive     TRIMETH/SULFA <=10 SENSITIVE Sensitive     CLINDAMYCIN >=8 RESISTANT Resistant     RIFAMPIN <=0.5 SENSITIVE Sensitive     Inducible Clindamycin NEGATIVE Sensitive     * MODERATE METHICILLIN RESISTANT STAPHYLOCOCCUS AUREUS  Aerobic/Anaerobic Culture (surgical/deep wound)     Status: None (Preliminary result)   Collection Time: 12/22/17 12:10 PM  Result Value Ref Range Status   Specimen Description FLUID  Final   Special Requests LEFT FLEXOR TENOSYNOVITIS  Final   Gram Stain   Final    RARE WBC PRESENT, PREDOMINANTLY PMN RARE GRAM POSITIVE COCCI IN PAIRS IN SINGLES Performed at [facility], [address], [place], [place] [address]    Culture   Final    RARE UNIDENTIFIED ORGANISM IDENTIFICATION AND SUSCEPTIBILITIES TO FOLLOW NO ANAEROBES ISOLATED; CULTURE IN PROGRESS FOR 5 DAYS    Report Status PENDING  Incomplete     Time coordinating discharge: 35 minutes.   SIGNED:   Elmarie Shiley, MD  [facility] 12/24/2017, 1:53 PM Pager   If 7PM-7AM, please contact night-coverage [email or web address] Password [email or web address]

## 2017-12-24 NOTE — Progress Notes (Signed)
Occupational Therapy Treatment Patient Details Name: Scott Cruz MRN: [id number] DOB: Mar 22, 1946 Today's Date: 12/24/2017    History of present illness Pt is a 72 y.o. male admitted 12/21/17 with finger swelling; worked up for cellulitis and lymphangitis of the left third digit. PMH includes HTN, HLD, prostate CA.  Pt underwent dorsal superficial debridement of L middle finger flexor tenosynovitis with follow up ROM ordered as well as wound care from PT.   OT comments  Pt making good progress towards OT goals, presents sitting up in recliner agreeable to OT tx session. Pt participating in LUE AROM exercises across multiple planes and reps. Pt reports some pain with digit ROM in LUE, but demonstrates fair motion in digits given pain and bandaging. Reviewed and further educated on importance of continued ROM, elevation and edema control with pt verbalizing understanding. Pt anticipating d/c later today.    Follow Up Recommendations  Follow surgeon's recommendation for DC plan and follow-up therapies    Equipment Recommendations  None recommended by OT          Precautions / Restrictions Precautions Precautions: Fall Restrictions Weight Bearing Restrictions: No Other Position/Activity Restrictions: instructed pt to keep L arm elevated about heart to reduce swelling.       Mobility Bed Mobility               General bed mobility comments: OOB in recliner                                                                    ADL either performed or assessed with clinical judgement   ADL Overall ADL's : Modified independent                                       General ADL Comments: Reviewed LUE exercises and further educated on importance of LUE elevation and edema management with pt verbalizing and return demonstrating understanding                        Cognition Arousal/Alertness: Awake/alert Behavior During Therapy:  WFL for tasks assessed/performed Overall Cognitive Status: Within Functional Limits for tasks assessed                                          Exercises General Exercises - Upper Extremity Shoulder Flexion: AROM;10 reps;Left;Seated Elbow Flexion: AROM;10 reps;Seated Elbow Extension: AROM;10 reps;Left;Seated Wrist Flexion: AROM;10 reps;Left;Seated Wrist Extension: AROM;10 reps;Left Digit Composite Flexion: AROM;10 reps;Left;Seated Composite Extension: AROM;10 reps;Left;Seated Hand Exercises Forearm Supination: AROM;10 reps;Left;Seated Forearm Pronation: AROM;Left;10 reps;Seated Digit Composite Flexion: AROM;Left;10 reps;Seated Composite Extension: AROM;Left;10 reps;Seated Opposition: AROM;Left;10 reps;Seated                Pertinent Vitals/ Pain       Pain Assessment: 0-10 Pain Score: 5  Pain Location: L middle finger Pain Descriptors / Indicators: Sore;Burning Pain Intervention(s): Monitored during session;Repositioned  Frequency  Min 2X/week        Progress Toward Goals  OT Goals(current goals can now be found in the care plan section)  Progress towards OT goals: Progressing toward goals  Acute Rehab OT Goals Patient Stated Goal: to move L hand well OT Goal Formulation: With patient Time For Goal Achievement: 12/30/17 Potential to Achieve Goals: Good  Plan Discharge plan remains appropriate    AM-PAC PT "6 Clicks" Daily Activity     Outcome Measure   Help from another person eating meals?: None Help from another person taking care of personal grooming?: None Help from another person toileting, which includes using toliet, bedpan, or urinal?: A Little Help from another person bathing (including washing, rinsing, drying)?: A Little Help from another person to put on and taking off regular upper body clothing?: None Help from another person to put on and  taking off regular lower body clothing?: None 6 Click Score: 22    End of Session    OT Visit Diagnosis: Muscle weakness (generalized) (M62.81)   Activity Tolerance Patient tolerated treatment well   Patient Left in chair;with call bell/phone within reach   Nurse Communication Mobility status        Time: 8786-7672 OT Time Calculation (min): 10 min  Charges: OT General Charges $OT Visit: 1 Visit OT Treatments $Therapeutic Activity: 8-22 mins  Lou Cal, OT Pager [phone] 12/24/2017    Raymondo Band 12/24/2017, 3:28 PM

## 2017-12-24 NOTE — Discharge Instructions (Signed)
Instructions for Hibiclens soaks   The Hibiclens solution can be found in your local pharmacy.    To use: Add 1 tablespoon of Hibiclens solution to 1 quart of warm water. The water does not need to be sterile.  Soaks the hand and diluted solution for 5-10 minutes, 1-2 times per day.  Opening close the fingers in the warm water solution trying to make a full fist.  Continue the soaks daily until the wound is closed, then stop

## 2017-12-24 NOTE — Progress Notes (Signed)
Physical Therapy Wound Treatment Patient Details  Name: Scott Cruz MRN: [id number] Date of Birth: [birth year]  Today's Date: 12/24/2017 Time:  -     Subjective  Subjective: Pt pleasant and agreeable to hydrotherapy. States he has less pain now.  Patient and Family Stated Goals: Heal wound Date of Onset: 12/21/17 Prior Treatments: abscess drainage and tenosynovectomy on 12/22/17  Pain Score: Increased pain this session but was premedicated.  Wound Assessment  Wound / Incision (Open or Dehisced) 12/23/17 Degloving;Incision - Open Finger (Comment which one) Left;Posterior Dorsal aspect of middle fnger (Active)  Wound Image   12/23/2017  9:34 AM  Dressing Type Gauze (Comment);Moist to dry;Non adherent 12/24/2017  1:31 PM  Dressing Changed Changed 12/24/2017  1:31 PM  Dressing Status Clean;Dry;Intact 12/24/2017  1:31 PM  Dressing Change Frequency Daily 12/24/2017  1:31 PM  Site / Wound Assessment Red;Yellow 12/24/2017  1:31 PM  % Wound base Red or Granulating 95% 12/24/2017  1:31 PM  % Wound base Yellow/Fibrinous Exudate 5% 12/24/2017  1:31 PM  % Wound base Black/Eschar 0% 12/24/2017  1:31 PM  % Wound base Other/Granulation Tissue (Comment) 0% 12/24/2017  1:31 PM  Peri-wound Assessment Intact 12/24/2017  1:31 PM  Wound Length (cm) 5.2 cm 12/23/2017  9:34 AM  Wound Width (cm) 3.8 cm 12/23/2017  9:34 AM  Wound Depth (cm) 0.1 cm 12/23/2017  9:34 AM  Wound Volume (cm^3) 1.98 cm^3 12/23/2017  9:34 AM  Wound Surface Area (cm^2) 19.76 cm^2 12/23/2017  9:34 AM  Tunneling (cm) 0 12/23/2017  9:34 AM  Undermining (cm) 0 12/23/2017  9:34 AM  Margins Unattached edges (unapproximated) 12/24/2017  1:31 PM  Closure None 12/24/2017  1:31 PM  Drainage Amount Minimal 12/24/2017  1:31 PM  Drainage Description Serosanguineous 12/24/2017  1:31 PM  Treatment Hydrotherapy (Pulse lavage);Packing (Saline gauze) 12/24/2017  1:31 PM   Hydrotherapy Pulsed lavage therapy - wound location: Dorsal aspect of L middle finger Pulsed Lavage  with Suction (psi): 4 psi Pulsed Lavage with Suction - Normal Saline Used: 1000 mL Pulsed Lavage Tip: Tip with splash shield   Wound Assessment and Plan  Wound Therapy - Assess/Plan/Recommendations Wound Therapy - Clinical Statement: More painful today. Tissue appears healthy however several areas of small off-white to yellow areas - unsure if these are fat pockets or areas still present from abscess. Pt will benefit from continued hydrotherapy to decrease bioburden and promote wound bed healing.  Wound Therapy - Functional Problem List: Decreased AROM and acute pain Factors Delaying/Impairing Wound Healing: Infection - systemic/local Hydrotherapy Plan: Debridement;Dressing change;Patient/family education;Pulsatile lavage with suction Wound Therapy - Frequency: 6X / week Wound Therapy - Follow Up Recommendations: Home health RN Wound Plan: See above  Wound Therapy Goals- Improve the function of patient's integumentary system by progressing the wound(s) through the phases of wound healing (inflammation - proliferation - remodeling) by: Decrease Necrotic Tissue to: 0% Decrease Necrotic Tissue - Progress: Progressing toward goal Increase Granulation Tissue to: 100% Increase Granulation Tissue - Progress: Progressing toward goal Goals/treatment plan/discharge plan were made with and agreed upon by patient/family: Yes Time For Goal Achievement: 7 days Wound Therapy - Potential for Goals: Excellent  Goals will be updated until maximal potential achieved or discharge criteria met.  Discharge criteria: when goals achieved, discharge from hospital, MD decision/surgical intervention, no progress towards goals, refusal/missing three consecutive treatments without notification or medical reason.  GP     Thelma Comp 12/24/2017, 1:34 PM  Rolinda Roan, PT, DPT Acute Rehabilitation Services Pager: [phone]

## 2017-12-24 NOTE — Care Management Note (Addendum)
Case Management Note  Patient Details  Name: Scott Cruz MRN: [id number] Date of Birth: 1946-05-31  Subjective/Objective:     Pt admitted with cellulitis of lt middle finger. He is from home with spouse.                Action/Plan: Plan is for patient to return home when medically ready. CM following for any home health needs.    1440: addendum: pt discharging home with [facility] RN for wound care. CM provided the patient choice of [place] agencies and he selected [facility]. Butch Penny with [facility] notified and accepted the referral.  Pt states he has transportation home.    Expected Discharge Date:  12/23/17               Expected Discharge Plan:  [place]  In-House Referral:     Discharge planning Services  CM Consult  Post Acute Care Choice:    Choice offered to:     DME Arranged:    DME Agency:     HH Arranged:    HH Agency:     Status of Service:  In process, will continue to follow  If discussed at Long Length of Stay Meetings, dates discussed:    Additional Comments:  Pollie Friar, RN 12/24/2017, 11:11 AM

## 2017-12-24 NOTE — Progress Notes (Signed)
Patient with discharge order but awaiting on transportation from family.   Ave Filter, RN

## 2017-12-25 NOTE — Anesthesia Postprocedure Evaluation (Signed)
Anesthesia Post Note  Patient: Scott Cruz  Procedure(s) Performed: IRRIGATION AND DEBRIDEMENT LEFT  LONG FINGER ABSCESS (Left Middle Finger)     Patient location during evaluation: PACU Anesthesia Type: General Level of consciousness: awake and alert Pain management: pain level controlled Vital Signs Assessment: post-procedure vital signs reviewed and stable Respiratory status: spontaneous breathing, nonlabored ventilation, respiratory function stable and patient connected to nasal cannula oxygen Cardiovascular status: blood pressure returned to baseline and stable Postop Assessment: no apparent nausea or vomiting Anesthetic complications: no    Last Vitals:  Vitals:   12/24/17 0744 12/24/17 1229  BP: 117/68 (!) 122/58  Pulse: (!) 57 (!) 52  Resp: 18 16  Temp: 36.6 C 36.7 C  SpO2: 97% 97%    Last Pain:  Vitals:   12/24/17 1229  TempSrc: Oral  PainSc:                  Catalina Gravel

## 2017-12-27 LAB — AEROBIC/ANAEROBIC CULTURE W GRAM STAIN (SURGICAL/DEEP WOUND)

## 2017-12-27 LAB — AEROBIC/ANAEROBIC CULTURE (SURGICAL/DEEP WOUND)

## 2019-09-28 ENCOUNTER — Ambulatory Visit: Payer: Medicare Other | Attending: Internal Medicine

## 2019-09-28 DIAGNOSIS — Z23 Encounter for immunization: Secondary | ICD-10-CM | POA: Insufficient documentation

## 2019-09-28 NOTE — Progress Notes (Signed)
   Covid-19 Vaccination Clinic  Name:  Scott Cruz    MRN: [id number] DOB: 1946/07/01  09/28/2019  Scott Cruz was observed post Covid-19 immunization for 15 minutes without incidence. He was provided with Vaccine Information Sheet and instruction to access the V-Safe system.   Scott Cruz was instructed to call 911 with any severe reactions post vaccine: Marland Kitchen Difficulty breathing  . Swelling of your face and throat  . A fast heartbeat  . A bad rash all over your body  . Dizziness and weakness    Immunizations Administered    Name Date Dose VIS Date Route   Pfizer COVID-19 Vaccine 09/28/2019  4:39 PM 0.3 mL 07/24/2019 Intramuscular   Manufacturer: [employer]   Lot: [id number]   [place]: [id number]

## 2019-10-20 ENCOUNTER — Ambulatory Visit: Payer: Medicare Other | Attending: Internal Medicine

## 2019-10-20 DIAGNOSIS — Z23 Encounter for immunization: Secondary | ICD-10-CM | POA: Insufficient documentation

## 2019-10-20 NOTE — Progress Notes (Signed)
   Covid-19 Vaccination Clinic  Name:  Scott Cruz    MRN: [id number] DOB: [birth year]  10/20/2019  [name] was observed post Covid-19 immunization for 15 minutes without incident. He was provided with Vaccine Information Sheet and instruction to access the V-Safe system.   [name] was instructed to call 911 with any severe reactions post vaccine: Marland Kitchen Difficulty breathing  . Swelling of face and throat  . A fast heartbeat  . A bad rash all over body  . Dizziness and weakness   Immunizations Administered    Name Date Dose VIS Date Route   Pfizer COVID-19 Vaccine 10/20/2019  4:24 PM 0.3 mL 07/24/2019 Intramuscular   Manufacturer: [place]   Lot: [id number]   [place]: [id number]

## 2019-10-21 ENCOUNTER — Ambulatory Visit: Payer: Medicare Other

## 2019-10-26 ENCOUNTER — Ambulatory Visit: Payer: Medicare Other

## 2019-10-26 DIAGNOSIS — Z23 Encounter for immunization: Secondary | ICD-10-CM

## 2019-10-26 NOTE — Progress Notes (Signed)
   Covid-19 Vaccination Clinic  Name:  Scott Cruz    MRN: [id number] DOB: 03/26/1948  10/26/2019  Scott Cruz was observed post Covid-19 immunization for 15 minutes without incident. He was provided with Vaccine Information Sheet and instruction to access the V-Safe system.   Scott Cruz was instructed to call 911 with any severe reactions post vaccine: Marland Kitchen Difficulty breathing  . Swelling of face and throat  . A fast heartbeat  . A bad rash all over body  . Dizziness and weakness   Immunizations Administered    Name Date Dose VIS Date Route   Pfizer COVID-19 Vaccine 10/26/2019  9:19 AM 0.3 mL 07/24/2019 Intramuscular   Manufacturer: [place]   Lot: [id number]   [place]: [id number]

## 2020-02-02 ENCOUNTER — Other Ambulatory Visit: Payer: Self-pay | Admitting: *Deleted

## 2020-02-02 ENCOUNTER — Encounter: Payer: Self-pay | Admitting: *Deleted

## 2020-02-03 ENCOUNTER — Ambulatory Visit (INDEPENDENT_AMBULATORY_CARE_PROVIDER_SITE_OTHER): Payer: Medicare Other | Admitting: Diagnostic Neuroimaging

## 2020-02-03 ENCOUNTER — Encounter: Payer: Self-pay | Admitting: Diagnostic Neuroimaging

## 2020-02-03 ENCOUNTER — Other Ambulatory Visit: Payer: Self-pay

## 2020-02-03 VITALS — BP 119/72 | HR 65 | Wt 144.5 lb

## 2020-02-03 DIAGNOSIS — R202 Paresthesia of skin: Secondary | ICD-10-CM | POA: Diagnosis not present

## 2020-02-03 DIAGNOSIS — R200 Anesthesia of skin: Secondary | ICD-10-CM

## 2020-02-03 NOTE — Progress Notes (Signed)
[facility]  PATIENT: Scott Cruz DOB: November 10, 1945  REFERRING CLINICIAN: Cher Nakai, MD HISTORY FROM: PATIENT  REASON FOR VISIT: NEW CONSULT    HISTORICAL  CHIEF COMPLAINT:  Chief Complaint  Patient presents with  . Peripheral Neuropathy    rm 7  New Pt, nephew- Tuyen    HISTORY OF PRESENT ILLNESS:   74 year old male here for evaluation of numbness and tingling in hands and feet.  Symptoms have been present for at least past 10 years.  He has had lumbar spine surgery in 2013.  Continues to have low back pain.  He was evaluated in 2018 by another neurologist, who reviewed MRIs of cervical lumbar spine demonstrating multilevel spinal stenosis and foraminal stenosis.  Patient did not want to pursue surgical treatments at that time.  EMG was ordered but patient did not complete testing as he was not sure about undergoing carpal tunnel surgery if that was found.  Since that time symptoms have been persistent.  He denies any significant pain in his hands or feet.  He feels numbness.  He feels difficulty picking up small objects from the floor because he cannot feel them.   REVIEW OF SYSTEMS: Full 14 system review of systems performed and negative with exception of: As per HPI.  ALLERGIES: No Known Allergies  HOME MEDICATIONS: Outpatient Medications Prior to Visit  Medication Sig Dispense Refill  . aspirin EC 81 MG tablet Take 81 mg by mouth daily.    . ergocalciferol (VITAMIN D2) 1.25 MG (50000 UT) capsule Take by mouth once a week.    . gabapentin (NEURONTIN) 300 MG capsule Take 300 mg by mouth 2 (two) times daily.    . hydrochlorothiazide (HYDRODIURIL) 25 MG tablet Take 25 mg by mouth daily.    . simvastatin (ZOCOR) 20 MG tablet Take 20 mg by mouth every evening.    . vitamin B-12 (CYANOCOBALAMIN) 1000 MCG tablet Take 1,000 mcg by mouth daily.    Marland Kitchen oxyCODONE (OXY IR/ROXICODONE) 5 MG immediate release tablet Take 1 tablet (5 mg total) by mouth every 6 (six) hours  as needed for moderate pain. (Patient not taking: Reported on 02/03/2020) 20 tablet 0  . senna-docusate (SENOKOT-S) 8.6-50 MG tablet Take 1 tablet by mouth at bedtime. (Patient not taking: Reported on 02/03/2020) 30 tablet 0  . sulfamethoxazole-trimethoprim (BACTRIM DS,SEPTRA DS) 800-160 MG tablet Take 1 tablet by mouth 2 (two) times daily. 14 tablet 0   No facility-administered medications prior to visit.    PAST MEDICAL HISTORY: Past Medical History:  Diagnosis Date  . Hard of hearing   . Hyperlipidemia   . Hypertension   . Osteoarthritis   . Peripheral neuropathy   . Prostate CA [facility])     PAST SURGICAL HISTORY: Past Surgical History:  Procedure Laterality Date  . BACK SURGERY  2013   lower   . I & D EXTREMITY Left 12/22/2017   Procedure: IRRIGATION AND DEBRIDEMENT LEFT  LONG FINGER ABSCESS;  Surgeon: Hiram Gash, MD;  Location: [place];  Service: Orthopedics;  Laterality: Left;    FAMILY HISTORY: No family history on file.  SOCIAL HISTORY: Social History   Socioeconomic History  . Marital status: Married    Spouse name: Hngling  . Number of children: 1  . Years of education: 23  . Highest education level: High school graduate  Occupational History    Comment: retired  Tobacco Use  . Smoking status: Current Every Day Smoker    Packs/day: 1.00  Years: 50.00    Pack years: 50.00    Types: Cigarettes  . Smokeless tobacco: Never Used  Substance and Sexual Activity  . Alcohol use: No  . Drug use: No  . Sexual activity: Not on file  Other Topics Concern  . Not on file  Social History Narrative   Lives with wife, grand daughter   Caffeine- coffee 1 daily   Social Determinants of Health   Financial Resource Strain:   . Difficulty of Paying Living Expenses:   Food Insecurity:   . Worried About Charity fundraiser in the Last Year:   . Arboriculturist in the Last Year:   Transportation Needs:   . Film/video editor (Medical):   Marland Kitchen Lack of Transportation  (Non-Medical):   Physical Activity:   . Days of Exercise per Week:   . Minutes of Exercise per Session:   Stress:   . Feeling of Stress :   Social Connections:   . Frequency of Communication with Friends and Family:   . Frequency of Social Gatherings with Friends and Family:   . Attends Religious Services:   . Active Member of Clubs or Organizations:   . Attends Archivist Meetings:   Marland Kitchen Marital Status:   Intimate Partner Violence:   . Fear of Current or Ex-Partner:   . Emotionally Abused:   Marland Kitchen Physically Abused:   . Sexually Abused:      PHYSICAL EXAM  GENERAL EXAM/CONSTITUTIONAL: Vitals:  Vitals:   02/03/20 1024  BP: 119/72  Pulse: 65  Weight: 144 lb 8 oz (65.5 kg)     Body mass index is 24.8 kg/m. Wt Readings from Last 3 Encounters:  02/03/20 144 lb 8 oz (65.5 kg)  12/21/17 145 lb (65.8 kg)  01/22/17 145 lb (65.8 kg)     Patient is in no distress; well developed, nourished and groomed; neck is supple  CARDIOVASCULAR:  Examination of carotid arteries is normal; no carotid bruits  Regular rate and rhythm, no murmurs  Examination of peripheral vascular system by observation and palpation is normal  EYES:  Ophthalmoscopic exam of optic discs and posterior segments is normal; no papilledema or hemorrhages  No exam data present  MUSCULOSKELETAL:  Gait, strength, tone, movements noted in Neurologic exam below  NEUROLOGIC: MENTAL STATUS:  No flowsheet data found.  awake, alert, oriented to person, place and time  recent and remote memory intact  normal attention and concentration  language fluent, comprehension intact, naming intact  fund of knowledge appropriate  CRANIAL NERVE:   2nd - no papilledema on fundoscopic exam  2nd, 3rd, 4th, 6th - pupils equal and reactive to light, visual fields full to confrontation, extraocular muscles intact, no nystagmus  5th - facial sensation symmetric  7th - facial strength symmetric  8th -  hearing intact  9th - palate elevates symmetrically, uvula midline  11th - shoulder shrug symmetric  12th - tongue protrusion midline  MOTOR:   normal bulk and tone, full strength in the BUE, BLE  SENSORY:   normal and symmetric to light touch, pinprick, temperature, vibration  COORDINATION:   finger-nose-finger, fine finger movements normal  REFLEXES:   deep tendon reflexes TRACE and symmetric  GAIT/STATION:   narrow based gait     DIAGNOSTIC DATA (LABS, IMAGING, TESTING) - I reviewed patient records, labs, notes, testing and imaging myself where available.  Lab Results  Component Value Date   WBC 15.9 (H) 12/24/2017   HGB 11.6 (L) 12/24/2017  HCT 33.8 (L) 12/24/2017   MCV 61.3 (L) 12/24/2017   PLT 218 12/24/2017      Component Value Date/Time   NA 139 12/23/2017 0747   K 3.8 12/23/2017 0747   CL 108 12/23/2017 0747   CO2 23 12/23/2017 0747   GLUCOSE 170 (H) 12/23/2017 0747   BUN 12 12/23/2017 0747   CREATININE 0.97 12/23/2017 0747   CALCIUM 8.0 (L) 12/23/2017 0747   PROT 7.5 12/21/2017 2050   ALBUMIN 4.3 12/21/2017 2050   AST 31 12/21/2017 2050   ALT 26 12/21/2017 2050   ALKPHOS 42 12/21/2017 2050   BILITOT 1.1 12/21/2017 2050   GFRNONAA >60 12/23/2017 0747   GFRAA >60 12/23/2017 0747   No results found for: CHOL, HDL, LDLCALC, LDLDIRECT, TRIG, CHOLHDL No results found for: HGBA1C No results found for: VITAMINB12 No results found for: TSH  12/04/16 MRI cervical spine  1.Moderate to severe degenerative disc disease especially at C5-C6 and C6-C7 with endplate degenerative changes and osteophytes.  2.Apparent degenerative subluxations at multiple levels with straightening of the cervical lordosis.  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  4.Multilevel protrusions combining with congenital narrowing of the canal and posterior process changes to cause moderate to severe impingement upon the cord at C4-C5 with  apparent myelomalacia within the cord at this level. There is apparent slight  impingement on the left anterior cord at C5-C6 with disc material approaching the left anterior cord at C6-C7.  5.Moderate to severe neural foraminal narrowing at multiple levels especially on the right at C4-C5 and C5-C6.  6.Nodules of the thyroid gland measuring up to 1.7 cm on the left. Follow up thyroid ultrasound recommended for further characterization. Bernette [facility] White Paper-February 2015)  12/04/16 MRI lumbar spine 1.Previous fusion at L4-L5 with bilateral pedicle screws, laminectomy at L3-L4 and L4-L5 and graft material at the L4-L5 level without evidence of bony bridging.  2.Moderate to severe degenerative disc disease of the lower thoracic and lumbar spine especially at L3-L4.  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  4.Multilevel disc bulges, protrusions and slight extrusions combining with posterior process hypertrophy and short pedicles to cause moderate to severe canal stenosiswith moderate canal stenosis at other lumbar levels.  5.Neural foraminal narrowing left more than right at L3-L4 with possible impingement on the exiting left L3 root. Moderate neural foraminal narrowing at multiple other levels.  6.Probable epidural scar about the thecal sac posteriorly at L3-L4 and diffusely at L4-L5.  7.Postsurgical atrophy of the lower lumbosacral paraspinous musculature.    ASSESSMENT AND PLAN  74 y.o. year old male here with numbness and tingling in hands and feet since 2020, likely related to his cervical and lumbar degenerative spine disease, spinal stenosis and polyradiculopathies.   Dx:  1. Numbness and tingling     PLAN:  HAND / FOOT NUMBNESS (since ~2010) - symptoms likely related to known cervical and lumbar degenerative spine disease (spinal stenosis and radiculopathies) --> conservative mgmt (patient does not want surgical treatments) - check neuropathy labs - denies  pain symptoms; therefore gabapentin, lyrica not indicated  Orders Placed This Encounter  Procedures  . CBC with diff  . CMP  . Vitamin B12  . A1c  . TSH  . SPEP with IFE  . ANA w/Reflex  . SSA, SSB  . ESR  . CRP  . Vitamin B6  . Hepatitis C antibody  . Hepatitis B core antibody, total  . Hepatitis B surface antigen  . Hepatitis B surface  antibody, qualitative  . RPR  . HIV  . ANCA  . Copper  . Vitamin B1   Return pending test results, for pending if symptoms worsen or fail to improve.    Penni Bombard, MD 9/96/9249, 32:41 AM Certified in Neurology, Neurophysiology and Neuroimaging  [facility] [address], [place], [place] [address] [phone]

## 2020-02-03 NOTE — Patient Instructions (Signed)
HAND / FOOT NUMBNESS (since ~2010) - symptoms likely related to neck and low back issues --> conservative mgmt (patient does not want surgical treatments)  - check neuropathy labs

## 2020-02-09 ENCOUNTER — Telehealth: Payer: Self-pay | Admitting: *Deleted

## 2020-02-09 LAB — COMPREHENSIVE METABOLIC PANEL
ALT: 11 IU/L (ref 0–44)
AST: 23 IU/L (ref 0–40)
Albumin/Globulin Ratio: 2 (ref 1.2–2.2)
Albumin: 4.6 g/dL (ref 3.7–4.7)
Alkaline Phosphatase: 43 IU/L — ABNORMAL LOW (ref 48–121)
BUN/Creatinine Ratio: 12 (ref 10–24)
BUN: 12 mg/dL (ref 8–27)
Bilirubin Total: 0.6 mg/dL (ref 0.0–1.2)
CO2: 30 mmol/L — ABNORMAL HIGH (ref 20–29)
Calcium: 9.2 mg/dL (ref 8.6–10.2)
Chloride: 94 mmol/L — ABNORMAL LOW (ref 96–106)
Creatinine, Ser: 0.98 mg/dL (ref 0.76–1.27)
GFR calc Af Amer: 87 mL/min/{1.73_m2} (ref >59)
GFR calc non Af Amer: 75 mL/min/{1.73_m2} (ref >59)
Globulin, Total: 2.3 g/dL (ref 1.5–4.5)
Glucose: 99 mg/dL (ref 65–99)
Potassium: 3.1 mmol/L — ABNORMAL LOW (ref 3.5–5.2)
Sodium: 138 mmol/L (ref 134–144)
Total Protein: 6.9 g/dL (ref 6.0–8.5)

## 2020-02-09 LAB — MULTIPLE MYELOMA PANEL, SERUM
Albumin SerPl Elph-Mcnc: 3.8 g/dL (ref 2.9–4.4)
Albumin/Glob SerPl: 1.3 (ref 0.7–1.7)
Alpha 1: 0.2 g/dL (ref 0.0–0.4)
Alpha2 Glob SerPl Elph-Mcnc: 0.8 g/dL (ref 0.4–1.0)
B-Globulin SerPl Elph-Mcnc: 1.1 g/dL (ref 0.7–1.3)
Gamma Glob SerPl Elph-Mcnc: 1 g/dL (ref 0.4–1.8)
Globulin, Total: 3.1 g/dL (ref 2.2–3.9)
IgA/Immunoglobulin A, Serum: 188 mg/dL (ref 61–437)
IgG (Immunoglobin G), Serum: 972 mg/dL (ref 603–1613)
IgM (Immunoglobulin M), Srm: 51 mg/dL (ref 15–143)

## 2020-02-09 LAB — CBC WITH DIFFERENTIAL/PLATELET
Basophils Absolute: 0.1 10*3/uL (ref 0.0–0.2)
Basos: 1 %
EOS (ABSOLUTE): 0.1 10*3/uL (ref 0.0–0.4)
Eos: 1 %
Hematocrit: 46.6 % (ref 37.5–51.0)
Hemoglobin: 15 g/dL (ref 13.0–17.7)
Immature Grans (Abs): 0 10*3/uL (ref 0.0–0.1)
Immature Granulocytes: 0 %
Lymphocytes Absolute: 2.5 10*3/uL (ref 0.7–3.1)
Lymphs: 33 %
MCH: 20.7 pg — ABNORMAL LOW (ref 26.6–33.0)
MCHC: 32.2 g/dL (ref 31.5–35.7)
MCV: 64 fL — ABNORMAL LOW (ref 79–97)
Monocytes Absolute: 0.7 10*3/uL (ref 0.1–0.9)
Monocytes: 9 %
Neutrophils Absolute: 4.1 10*3/uL (ref 1.4–7.0)
Neutrophils: 56 %
Platelets: 314 10*3/uL (ref 150–450)
RBC: 7.26 x10E6/uL (ref 4.14–5.80)
RDW: 19.9 % — ABNORMAL HIGH (ref 11.6–15.4)
WBC: 7.4 10*3/uL (ref 3.4–10.8)

## 2020-02-09 LAB — PAN-ANCA
ANCA Proteinase 3: <3.5 U/mL (ref 0.0–3.5)
Atypical pANCA: <1:20 {titer}
C-ANCA: <1:20 {titer}
Myeloperoxidase Ab: <9 U/mL (ref 0.0–9.0)
P-ANCA: <1:20 {titer}

## 2020-02-09 LAB — HEMOGLOBIN A1C
Est. average glucose Bld gHb Est-mCnc: 103 mg/dL
Hgb A1c MFr Bld: 5.2 % (ref 4.8–5.6)

## 2020-02-09 LAB — RPR: RPR Ser Ql: NONREACTIVE

## 2020-02-09 LAB — HEPATITIS C ANTIBODY: Hep C Virus Ab: <0.1 s/co ratio (ref 0.0–0.9)

## 2020-02-09 LAB — SJOGREN'S SYNDROME ANTIBODS(SSA + SSB)
ENA SSA (RO) Ab: 0.2 AI (ref 0.0–0.9)
ENA SSB (LA) Ab: <0.2 AI (ref 0.0–0.9)

## 2020-02-09 LAB — ANA W/REFLEX: ANA Titer 1: NEGATIVE

## 2020-02-09 LAB — TSH: TSH: 1.47 u[IU]/mL (ref 0.450–4.500)

## 2020-02-09 LAB — SEDIMENTATION RATE: Sed Rate: 38 mm/hr — ABNORMAL HIGH (ref 0–30)

## 2020-02-09 LAB — HEPATITIS B CORE ANTIBODY, TOTAL: Hep B Core Total Ab: POSITIVE — AB

## 2020-02-09 LAB — C-REACTIVE PROTEIN: CRP: 2 mg/L (ref 0–10)

## 2020-02-09 LAB — VITAMIN B6: Vitamin B6: 5.5 ug/L (ref 5.3–46.7)

## 2020-02-09 LAB — HEPATITIS B SURFACE ANTIBODY,QUALITATIVE: Hep B Surface Ab, Qual: NONREACTIVE

## 2020-02-09 LAB — HEPATITIS B SURFACE ANTIGEN: Hepatitis B Surface Ag: NEGATIVE

## 2020-02-09 LAB — HIV ANTIBODY (ROUTINE TESTING W REFLEX): HIV Screen 4th Generation wRfx: NONREACTIVE

## 2020-02-09 LAB — VITAMIN B12: Vitamin B-12: 975 pg/mL (ref 232–1245)

## 2020-02-09 LAB — COPPER, SERUM: Copper: 119 ug/dL (ref 69–132)

## 2020-02-09 LAB — VITAMIN B1: Thiamine: 75.6 nmol/L (ref 66.5–200.0)

## 2020-02-09 NOTE — Telephone Encounter (Signed)
Spoke with nephew Macon Large, on [place] and informed him the patient's labs show an abnormal CBC. Dr Leta Baptist recommends repeat CBC. 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 Follow up with PCP. Advised he monitor symptoms, call for any questions. Nephew verbalized understanding, appreciation. Lab results faxed to PCP.

## 2020-07-21 ENCOUNTER — Ambulatory Visit
Admission: RE | Admit: 2020-07-21 | Discharge: 2020-07-21 | Disposition: A | Discharge status: Home / Self Care | Payer: Medicare Other | Source: Ambulatory Visit | Attending: Registered Nurse | Admitting: Registered Nurse

## 2020-07-21 ENCOUNTER — Other Ambulatory Visit: Payer: Self-pay | Admitting: Registered Nurse

## 2020-07-21 ENCOUNTER — Other Ambulatory Visit: Payer: Self-pay

## 2020-07-21 DIAGNOSIS — M25571 Pain in right ankle and joints of right foot: Secondary | ICD-10-CM

## 2021-01-04 ENCOUNTER — Encounter (HOSPITAL_COMMUNITY): Payer: Self-pay | Admitting: *Deleted

## 2021-01-04 ENCOUNTER — Other Ambulatory Visit: Payer: Self-pay

## 2021-01-04 ENCOUNTER — Ambulatory Visit (HOSPITAL_COMMUNITY)
Admission: EM | Admit: 2021-01-04 | Discharge: 2021-01-04 | Disposition: A | Discharge status: Home / Self Care | Payer: Medicare Other | Attending: Internal Medicine | Admitting: Internal Medicine

## 2021-01-04 ENCOUNTER — Ambulatory Visit (INDEPENDENT_AMBULATORY_CARE_PROVIDER_SITE_OTHER): Payer: Medicare Other

## 2021-01-04 DIAGNOSIS — M79672 Pain in left foot: Secondary | ICD-10-CM

## 2021-01-04 MED ORDER — PREDNISONE 10 MG (21) PO TBPK: ORAL_TABLET | Freq: Every day | ORAL | 0 refills | Status: AC

## 2021-01-04 NOTE — ED Triage Notes (Signed)
Pt reports Lt foot pain with swelling. No injury.

## 2021-01-04 NOTE — ED Provider Notes (Addendum)
[facility]    CSN: [id number] Arrival date & time: 01/04/21  0841      History   Chief Complaint Chief Complaint  Patient presents with  . Foot Pain    HPI Scott Cruz is a 75 y.o. male. Translator Scott Cruz assisted for our visit  HPI   Foot Pain: Patient reports that 3 days ago he started to have fairly significant left foot pain.  He states for a few days before that he had some mild pain of the left foot in the same area that is currently causing pain.  No history of gout.  He denies any fevers, significant joint stiffness, calf pain, recent injury.  He has not tried anything for symptoms yet.  Past Medical History:  Diagnosis Date  . Hard of hearing   . Hyperlipidemia   . Hypertension   . Osteoarthritis   . Peripheral neuropathy   . Prostate CA [facility])     Patient Active Problem List   Diagnosis Date Noted  . Cellulitis of left middle finger 12/22/2017    Past Surgical History:  Procedure Laterality Date  . BACK SURGERY  2013   lower   . I & D EXTREMITY Left 12/22/2017   Procedure: IRRIGATION AND DEBRIDEMENT LEFT  LONG FINGER ABSCESS;  Surgeon: Hiram Gash, MD;  Location: [place];  Service: Orthopedics;  Laterality: Left;       Home Medications    Prior to Admission medications   Medication Sig Start Date End Date Taking? Authorizing Provider  aspirin EC 81 MG tablet Take 81 mg by mouth daily.    [provider]  ergocalciferol (VITAMIN D2) 1.25 MG (50000 UT) capsule Take by mouth once a week.    [provider]  gabapentin (NEURONTIN) 300 MG capsule Take 300 mg by mouth 2 (two) times daily. 01/27/20   [provider]  hydrochlorothiazide (HYDRODIURIL) 25 MG tablet Take 25 mg by mouth daily. 09/14/19   [provider]  oxyCODONE (OXY IR/ROXICODONE) 5 MG immediate release tablet Take 1 tablet (5 mg total) by mouth every 6 (six) hours as needed for moderate pain. Patient not taking: Reported on 02/03/2020 12/24/17    Regalado, Jerald Kief A, MD  senna-docusate (SENOKOT-S) 8.6-50 MG tablet Take 1 tablet by mouth at bedtime. Patient not taking: Reported on 02/03/2020 12/24/17   Regalado, Jerald Kief A, MD  simvastatin (ZOCOR) 20 MG tablet Take 20 mg by mouth every evening.    [provider]  vitamin B-12 (CYANOCOBALAMIN) 1000 MCG tablet Take 1,000 mcg by mouth daily.    [provider]    Family History History reviewed. No pertinent family history.  Social History Social History   Tobacco Use  . Smoking status: Current Every Day Smoker    Packs/day: 1.00    Years: 50.00    Pack years: 50.00    Types: Cigarettes  . Smokeless tobacco: Never Used  Substance Use Topics  . Alcohol use: No  . Drug use: No     Allergies   Patient has no known allergies.   Review of Systems Review of Systems  As stated above in HPI Physical Exam Triage Vital Signs ED Triage Vitals  Enc Vitals Group     BP 01/04/21 0912 (!) 151/73     Pulse Rate 01/04/21 0912 76     Resp 01/04/21 0912 16     Temp 01/04/21 0912 98.8 F (37.1 C)     Temp src --  SpO2 01/04/21 0912 99 %     Weight --      Height --      Head Circumference --      Peak Flow --      Pain Score 01/04/21 0909 10     Pain Loc --      Pain Edu? --      Excl. in [place]? --    No data found.  Updated Vital Signs BP (!) 151/73   Pulse 76   Temp 98.8 F (37.1 C)   Resp 16   SpO2 99%   Physical Exam Vitals and nursing note reviewed.  Constitutional:      Appearance: Normal appearance.  Cardiovascular:     Pulses: Normal pulses.  Musculoskeletal:        General: Swelling (left distal foot pain) and tenderness present.  Skin:    General: Skin is warm.     Findings: Erythema (Moderate erythema of the lft great MVP joint) present. No lesion.     Comments: No evidence of skin breakdown.   Neurological:     General: No focal deficit present.     Mental Status: He is alert.     Sensory: No sensory deficit.      UC  Treatments / Results  Labs (all labs ordered are listed, but only abnormal results are displayed) Labs Reviewed - No data to display  EKG   Radiology No results found.  Procedures Procedures (including critical care time)  Medications Ordered in UC Medications - No data to display  Initial Impression / Assessment and Plan / UC Course  I have reviewed the triage vital signs and the nursing notes.  Pertinent labs & imaging results that were available during my care of the patient were reviewed by me and considered in my medical decision making (see chart for details).     New. Concerning for gout. As he is afebrile without skin breakdown and x rays not suggestive of osteomyelitis will treat for gout with prednisone given his age and length of illness. Discussed how to take this medication along with common potential side effects and precautions. Discussed red flag signs and symptoms and close PCP follow up. He may also benefit from seeing a podiatrist.   Final Clinical Impressions(s) / UC Diagnoses   Final diagnoses:  None   Discharge Instructions   None    ED Prescriptions    None     PDMP not reviewed this encounter.   Hughie Closs, Hershal Coria 01/04/21 0959    Hughie Closs, PA-C 01/04/21 1008

## 2021-06-28 ENCOUNTER — Other Ambulatory Visit: Payer: Self-pay

## 2021-06-28 ENCOUNTER — Other Ambulatory Visit: Payer: Self-pay | Admitting: Registered Nurse

## 2021-06-28 ENCOUNTER — Ambulatory Visit
Admission: RE | Admit: 2021-06-28 | Discharge: 2021-06-28 | Disposition: A | Discharge status: Home / Self Care | Payer: Medicare Other | Source: Ambulatory Visit | Attending: Registered Nurse | Admitting: Registered Nurse

## 2021-06-28 DIAGNOSIS — R0989 Other specified symptoms and signs involving the circulatory and respiratory systems: Secondary | ICD-10-CM

## 2023-05-16 IMAGING — CR DG CHEST 2V
2 series · 2 of 2 positions shown · non-contrast
Comparison: 10/15/2011

CLINICAL DATA: Cough.  Abnormal breath sounds on exam.

EXAM:
CHEST - 2 VIEW

[w chest pa]
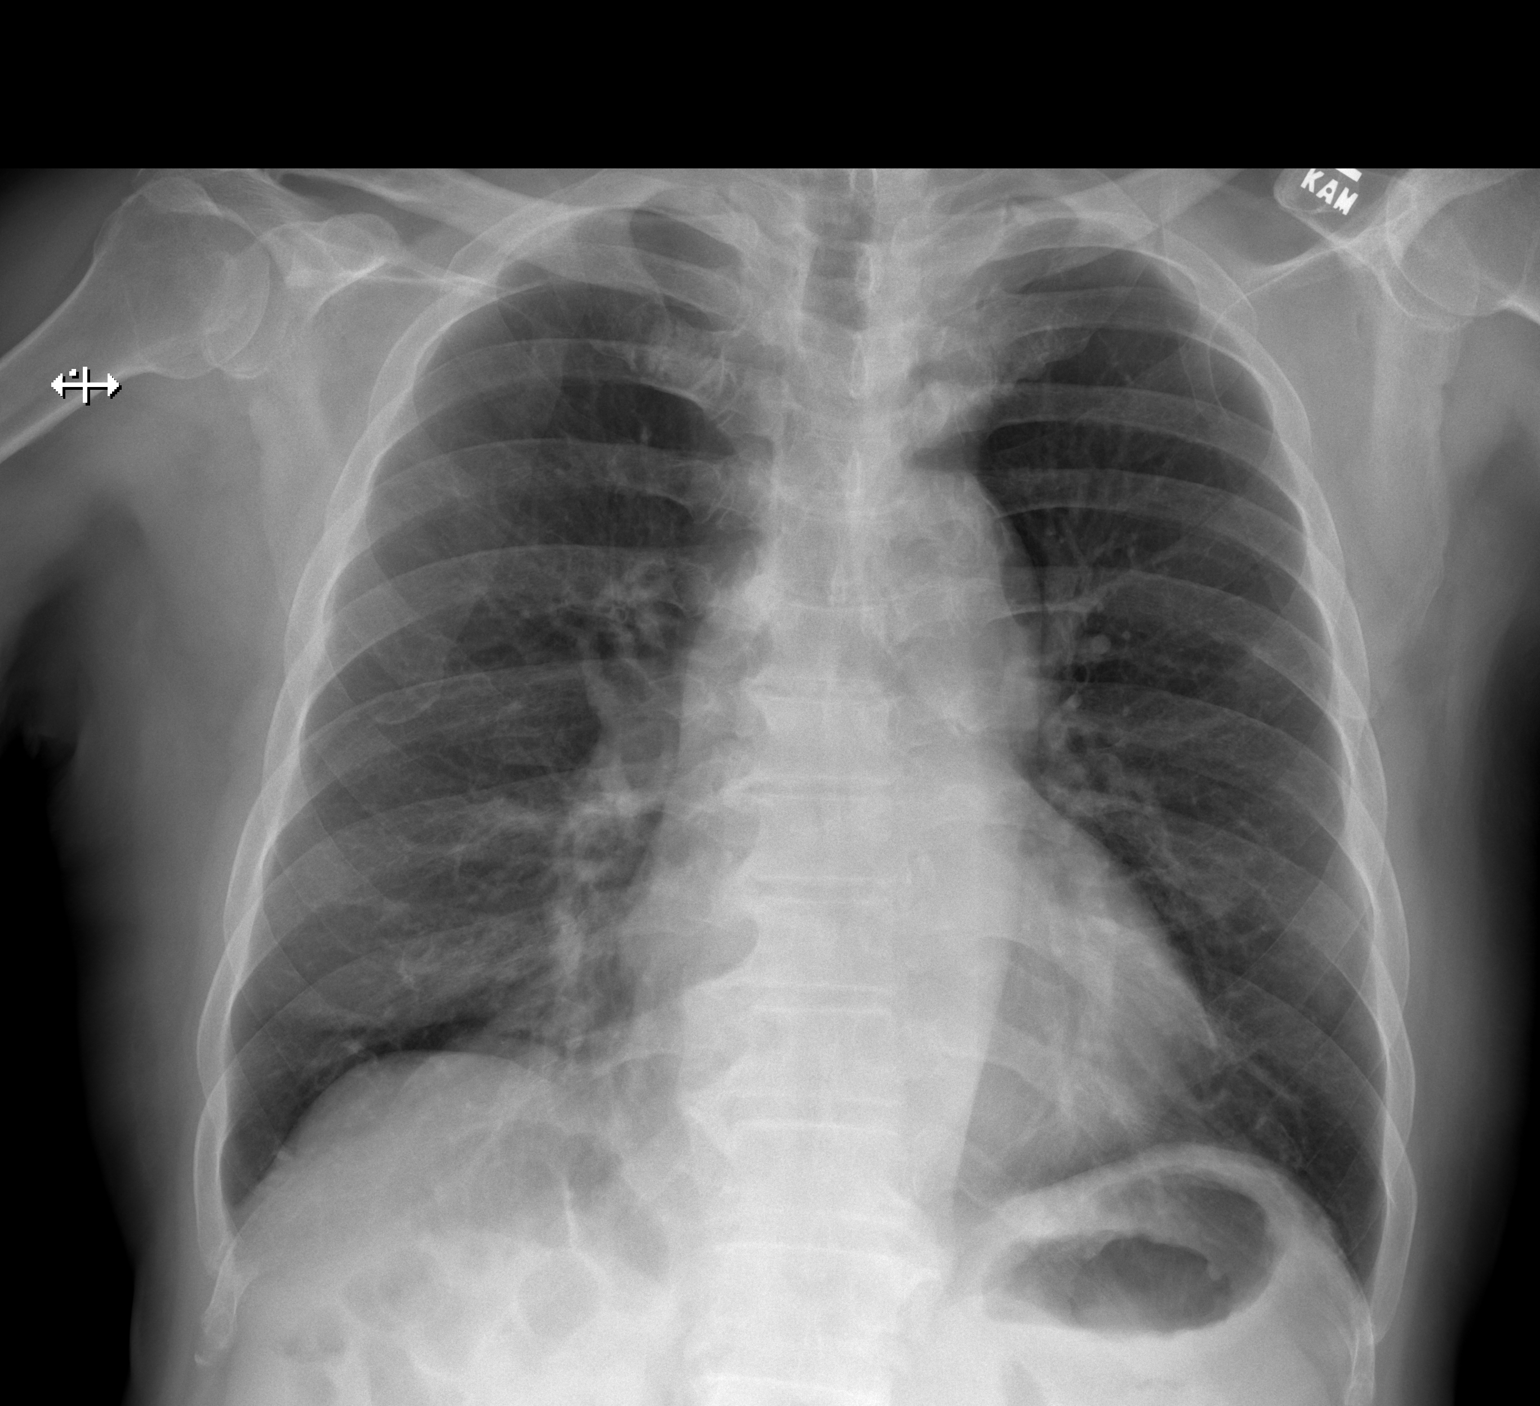

[w chest lat]
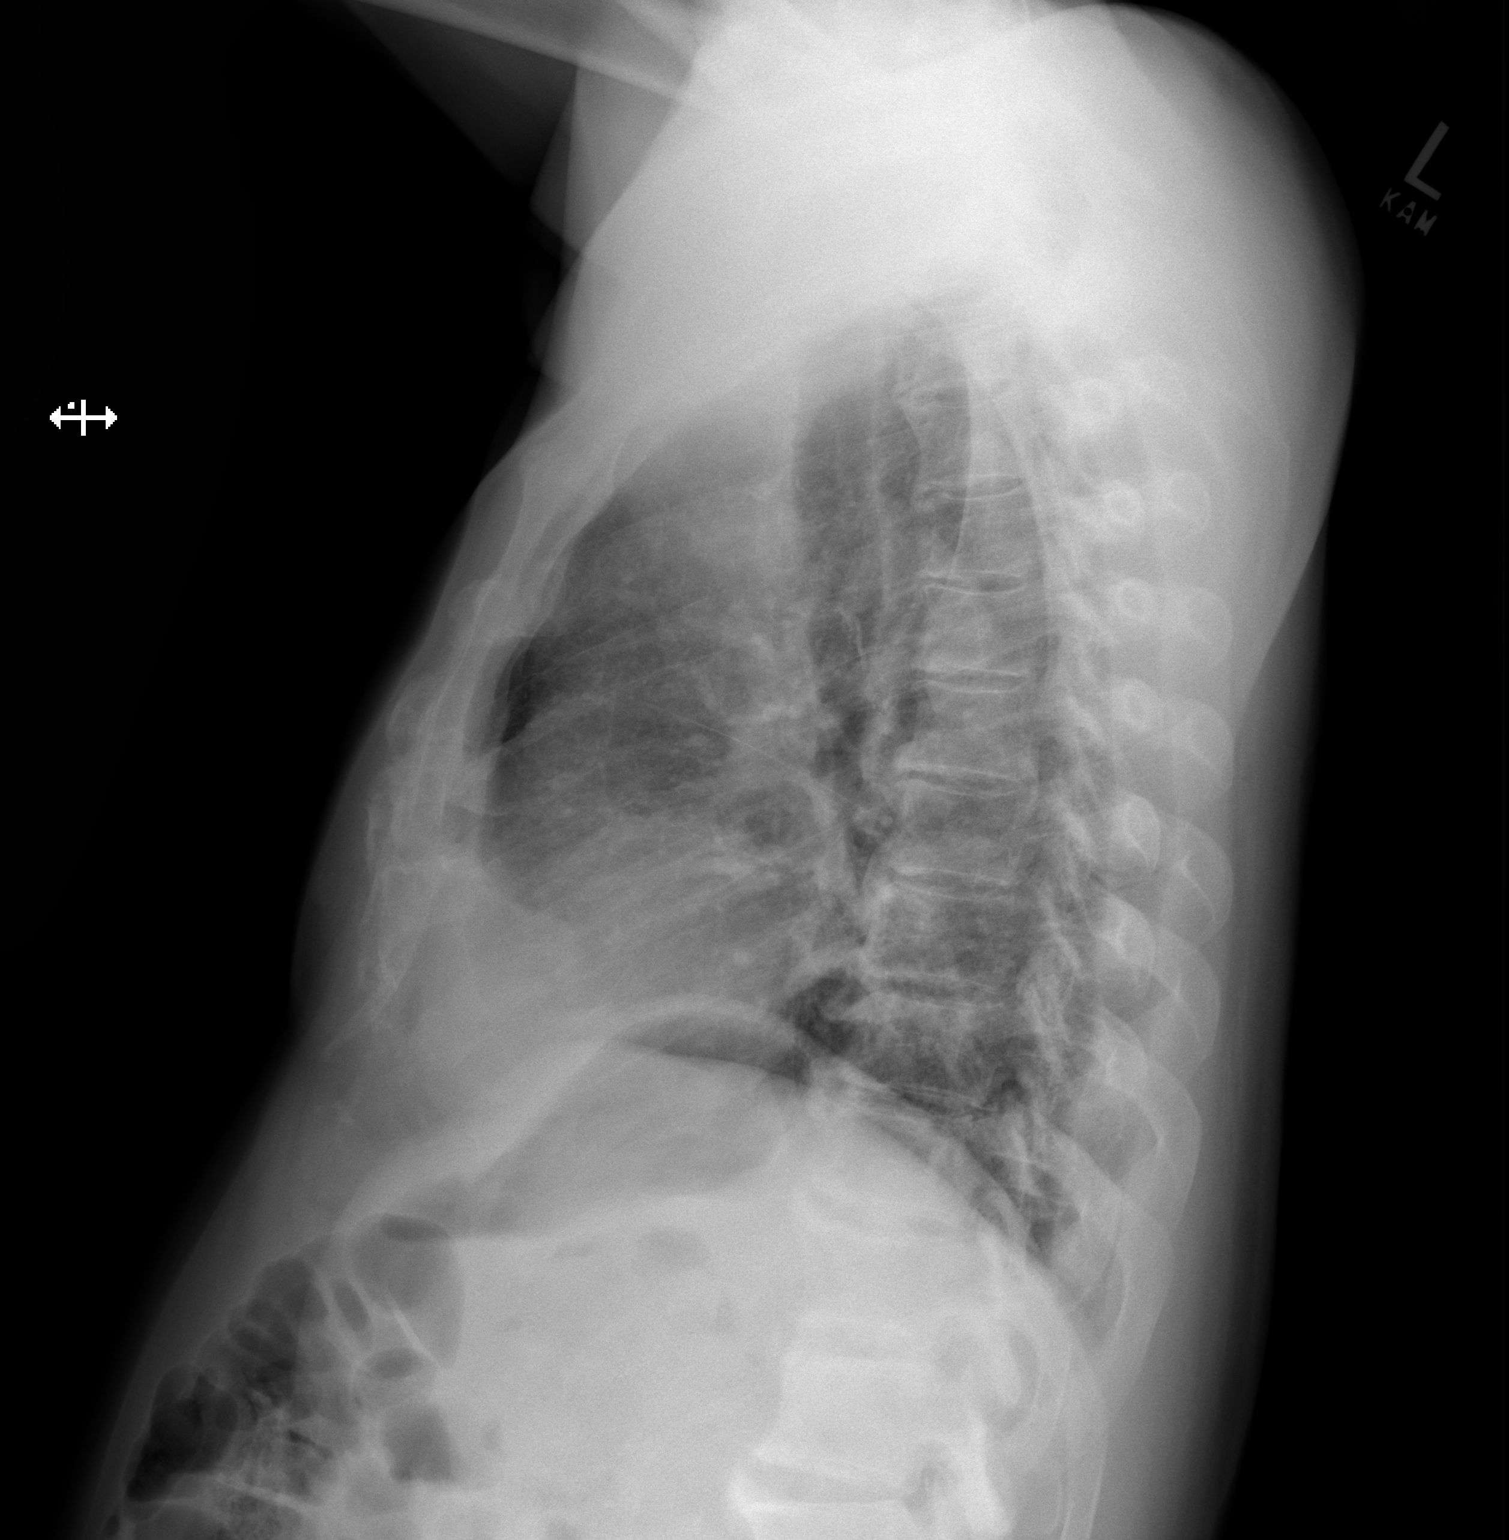

[2 of 2 positions shown; findings below may reference images not displayed]

FINDINGS: The heart size and mediastinal contours are within normal limits.
Both lungs are clear. The visualized skeletal structures are
unremarkable.
IMPRESSION: No active cardiopulmonary disease.
